# Patient Record
Sex: Female | Born: 1970 | Race: White | Hispanic: No | Marital: Married | Smoking: Former smoker
Health system: Southern US, Community
[De-identification: ages and names within clinical notes are randomized; demographics above are authoritative.]

## PROBLEM LIST (undated history)

## (undated) DIAGNOSIS — M1990 Unspecified osteoarthritis, unspecified site: Secondary | ICD-10-CM

## (undated) DIAGNOSIS — H409 Unspecified glaucoma: Secondary | ICD-10-CM

## (undated) DIAGNOSIS — D6851 Activated protein C resistance: Secondary | ICD-10-CM

## (undated) HISTORY — DX: Activated protein C resistance: D68.51

## (undated) HISTORY — PX: TUBAL LIGATION: SHX77

## (undated) HISTORY — PX: DILATION AND CURETTAGE OF UTERUS: SHX78

## (undated) HISTORY — DX: Unspecified glaucoma: H40.9

## (undated) HISTORY — DX: Unspecified osteoarthritis, unspecified site: M19.90

---

## 2005-11-25 ENCOUNTER — Ambulatory Visit (HOSPITAL_COMMUNITY): Admission: RE | Admit: 2005-11-25 | Discharge: 2005-11-25 | Payer: Self-pay | Admitting: Family Medicine

## 2007-02-04 ENCOUNTER — Ambulatory Visit (HOSPITAL_COMMUNITY): Admission: RE | Admit: 2007-02-04 | Discharge: 2007-02-04 | Payer: Self-pay | Admitting: Family Medicine

## 2007-02-09 ENCOUNTER — Ambulatory Visit (HOSPITAL_COMMUNITY): Admission: RE | Admit: 2007-02-09 | Discharge: 2007-02-09 | Payer: Self-pay | Admitting: Family Medicine

## 2010-05-27 ENCOUNTER — Encounter: Payer: Self-pay | Admitting: Physician Assistant

## 2010-05-27 ENCOUNTER — Ambulatory Visit (HOSPITAL_COMMUNITY): Admission: RE | Admit: 2010-05-27 | Discharge: 2010-05-27 | Payer: Self-pay | Admitting: Family Medicine

## 2010-05-27 ENCOUNTER — Ambulatory Visit: Payer: Self-pay | Admitting: Family Medicine

## 2010-05-27 DIAGNOSIS — M545 Low back pain, unspecified: Secondary | ICD-10-CM | POA: Insufficient documentation

## 2010-05-27 DIAGNOSIS — N644 Mastodynia: Secondary | ICD-10-CM

## 2010-09-07 ENCOUNTER — Encounter: Admission: RE | Admit: 2010-09-07 | Discharge: 2010-09-07 | Payer: Self-pay | Admitting: Obstetrics and Gynecology

## 2011-01-27 NOTE — Letter (Signed)
Summary: Laboratory/X-Ray Results  [facility]  [address]   [place], [place] [address]   Phone: [phone]  Fax: [phone]    Lab/X-Ray Results  May 27, 2010  MRN: [id number]  HAFSA LOHN [address] [place], [place]  [address]    The results of your recent lab/x-ray has been reviewed and were found:  Your Lumbar Spine xray is normal.  You may continue Ibuprofen since this is working well for your pain.  If you are interested in trying Physical Therapy please let me know and I can do a referral. This may help strengthen your back and reduce some of the pain that you are having.  If you have any questions, please contact our office.     Esperanza Sheets PA

## 2011-01-27 NOTE — Assessment & Plan Note (Signed)
Summary: new patient- room 1   Vital Signs:  Patient profile:   40 year old female Menstrual status:  regular LMP:     05/26/2010 Height:      62.75 inches Weight:      152.75 pounds BMI:     27.37 O2 Sat:      98 % on Room air Pulse rate:   86 / minute Resp:     16 per minute BP sitting:   106 / 60  (left arm)  Vitals Entered By: Adella Hare LPN (May 27, 2010 3:13 PM) CC: new patient Is Patient Diabetic? No Pain Assessment Patient in pain? no      LMP (date): 05/26/2010     Menstrual Status regular Enter LMP: 05/26/2010   CC:  new patient.  History of Present Illness: New pt here to establish care with new PCP. Pt states she was unable to get in to GYN for 2 weeks, so they recommended she see her PCP. She noticed last week a tender area in her Lt Breast with swelling. No redness & no nipple dischg.  Is better today.  She started her menses yest.  Drinks 4-5 Dt Pepsi's per day, plus 1 cup coffee per day.  This is no change from her usual x yrs. She reports a hx of fibrocystic breasts and has had 2 nl mamms and 1 nl ultrasound in the past.   Lumbar back pain x couple yrs.  No injury.  No radiation or parasthesias LE.  Pain increased with activities that cause her to be bent over.  She uses Motrin 800 mg as needed & works well.     Current Medications (verified): 1)  None  Allergies (verified): 1)  ! Phenergan  Past History:  Past medical, surgical, family and social histories (including risk factors) reviewed for relevance to current acute and chronic problems.  Past Medical History: Factor V Leiden  Past Surgical History: Tubal ligation (2003)  Family History: Reviewed history and no changes required. mother living- borderline emphasema father living- presumed healthy two brothers living- autism MGM - breast CA  Social History: Reviewed history and no changes required. employed part time at [employer] Married 6 years two children 16, 10 Current  Smoker half a pack a day Alcohol use-socially Drug use-no Smoking Status:  current Drug Use:  no  Review of Systems General:  Denies chills and fever. CV:  Denies chest pain or discomfort and palpitations. GI:  Denies change in bowel habits. GU:  No loss of bladder control. MS:  Complains of low back pain; denies joint pain and mid back pain.  Physical Exam  General:  Well-developed,well-nourished,in no acute distress; alert,appropriate and cooperative throughout examination Head:  Normocephalic and atraumatic without obvious abnormalities. No apparent alopecia or balding. Ears:  External ear exam shows no significant lesions or deformities.  Otoscopic examination reveals clear canals, tympanic membranes are intact bilaterally without bulging, retraction, inflammation or discharge. Hearing is grossly normal bilaterally. Nose:  External nasal examination shows no deformity or inflammation. Nasal mucosa are pink and moist without lesions or exudates. Mouth:  Oral mucosa and oropharynx without lesions or exudates.  Teeth in good repair. Neck:  No deformities, masses, or tenderness noted. Breasts:  Bilat breast are very fibrocystic.  All nodules are smooth and mobile.  No dominant mass.  No skin changes, erythema, etc. Lungs:  Normal respiratory effort, chest expands symmetrically. Lungs are clear to auscultation, no crackles or wheezes. Heart:  Normal rate and regular  rhythm. S1 and S2 normal without gallop, murmur, click, rub or other extra sounds. Msk:  LS spine pt reports TTP Lumbar L 2 & L 3 spinous processes.  Nontender Lumbar musculature, SI, or sciatic notches.normal ROM.  Neg SLR Extremities:  No clubbing, cyanosis, edema, or deformity noted with normal full range of motion of all joints.   Neurologic:  alert & oriented X3, sensation intact to light touch, gait normal, and DTRs symmetrical and normal.   Axillary Nodes:  No palpable lymphadenopathy   Impression &  Recommendations:  Problem # 1:  MASTALGIA (ICD-611.71) Assessment New Discussed with pt that this is probably due to her menstrual cycle.  And her discomfort is better today from starting her menses yesterday.  She has a GYN appt in 2 weeks.  I suggest that she have this re-evaluated in 1-2 weeks.  Since she already has a gyn appt makes sense to have it done then.   Also strongly encouraged pt to discontinue her caffeine.  Discussed the effects of caffeine on her breast tissue but also increased risk for osteoporosis as she gets older.  Problem # 2:  BACK PAIN, LUMBAR, CHRONIC (ICD-724.2) Assessment: Unchanged Continue Ibuprofen as needed. Orders: Miscellaneous Other Radiology (Misc Other Rad)  Patient Instructions: 1)  Please schedule a follow-up appointment as needed.  Keep your appt with GYN. 2)  I have ordered an xray of your Lumbar spine. 3)  continue Ibuprofen as needed at this time

## 2018-02-08 DIAGNOSIS — H40013 Open angle with borderline findings, low risk, bilateral: Secondary | ICD-10-CM | POA: Diagnosis not present

## 2018-04-27 DIAGNOSIS — R201 Hypoesthesia of skin: Secondary | ICD-10-CM | POA: Diagnosis not present

## 2018-12-30 ENCOUNTER — Other Ambulatory Visit: Payer: Self-pay

## 2018-12-30 ENCOUNTER — Emergency Department: Payer: BC Managed Care – PPO

## 2018-12-30 DIAGNOSIS — R609 Edema, unspecified: Secondary | ICD-10-CM | POA: Insufficient documentation

## 2018-12-30 DIAGNOSIS — Z5321 Procedure and treatment not carried out due to patient leaving prior to being seen by health care provider: Secondary | ICD-10-CM | POA: Diagnosis not present

## 2018-12-30 NOTE — ED Triage Notes (Signed)
Pt in with co DVT states has long hx of the same has a blood clotting disorder. Pt noticed today pain and swelling to left thigh without injury, concerned about clot.

## 2018-12-31 ENCOUNTER — Emergency Department
Admission: EM | Admit: 2018-12-31 | Discharge: 2018-12-31 | Discharge status: Against Medical Advice | Payer: BC Managed Care – PPO | Attending: Emergency Medicine | Admitting: Emergency Medicine

## 2019-01-02 ENCOUNTER — Telehealth: Payer: Self-pay | Admitting: Emergency Medicine

## 2019-01-02 NOTE — Telephone Encounter (Signed)
Called patient due to lwot to inquire about condition and follow up plans. No answer and mailbox is full. 

## 2019-11-08 ENCOUNTER — Other Ambulatory Visit: Payer: Self-pay

## 2019-11-08 ENCOUNTER — Ambulatory Visit
Admission: RE | Admit: 2019-11-08 | Discharge: 2019-11-08 | Disposition: A | Discharge status: Home / Self Care | Payer: BC Managed Care – PPO | Source: Ambulatory Visit | Attending: Family Medicine | Admitting: Family Medicine

## 2019-11-08 ENCOUNTER — Encounter: Payer: Self-pay | Admitting: Family Medicine

## 2019-11-08 ENCOUNTER — Ambulatory Visit (INDEPENDENT_AMBULATORY_CARE_PROVIDER_SITE_OTHER): Payer: BC Managed Care – PPO | Admitting: Family Medicine

## 2019-11-08 VITALS — BP 116/76 | HR 76 | Temp 98.1°F | Resp 14 | Ht 63.0 in | Wt 162.6 lb

## 2019-11-08 DIAGNOSIS — M25572 Pain in left ankle and joints of left foot: Secondary | ICD-10-CM

## 2019-11-08 DIAGNOSIS — Z8781 Personal history of (healed) traumatic fracture: Secondary | ICD-10-CM | POA: Diagnosis not present

## 2019-11-08 DIAGNOSIS — Z7689 Persons encountering health services in other specified circumstances: Secondary | ICD-10-CM

## 2019-11-08 DIAGNOSIS — H409 Unspecified glaucoma: Secondary | ICD-10-CM

## 2019-11-08 DIAGNOSIS — M19072 Primary osteoarthritis, left ankle and foot: Secondary | ICD-10-CM | POA: Diagnosis not present

## 2019-11-08 DIAGNOSIS — D6851 Activated protein C resistance: Secondary | ICD-10-CM

## 2019-11-08 DIAGNOSIS — I872 Venous insufficiency (chronic) (peripheral): Secondary | ICD-10-CM

## 2019-11-08 DIAGNOSIS — M25472 Effusion, left ankle: Secondary | ICD-10-CM | POA: Diagnosis not present

## 2019-11-08 DIAGNOSIS — M7989 Other specified soft tissue disorders: Secondary | ICD-10-CM | POA: Diagnosis not present

## 2019-11-08 DIAGNOSIS — G4700 Insomnia, unspecified: Secondary | ICD-10-CM

## 2019-11-08 DIAGNOSIS — G8929 Other chronic pain: Secondary | ICD-10-CM | POA: Insufficient documentation

## 2019-11-08 DIAGNOSIS — R600 Localized edema: Secondary | ICD-10-CM

## 2019-11-08 MED ORDER — TRAZODONE HCL 50 MG PO TABS: 25.0000 mg | ORAL_TABLET | Freq: Every day | ORAL | 1 refills | Status: DC

## 2019-11-08 NOTE — Patient Instructions (Signed)

## 2019-11-08 NOTE — Progress Notes (Signed)
Name: Angela Irwin   MRN: [id number]    DOB: 1971/04/23   Date:11/10/2019       Progress Note  Chief Complaint  Patient presents with   Establish Care   Ankle Pain    hx of break, to late to have surgery when it was healed, this was 27 years ago.  Having pain last week but better this week.  swelling   Insomnia    trouble going and staying asleep     Subjective:   Angela Irwin is a 48 y.o. female, presents to clinic  To establish care here She moved here last year from from [place], and 4 years ago from [place] where she is from.   She has hx of factor V leiden , blood to left leg 2x in the past only when pregnant Has been to Hem/Onc, dx with factor V and "slight factor VIII" -she has not needed any medications except for when pregnant  Left ankle distal tibia fx 27 years ago, was nearly a compound fx, she was pregnant and pregnancy was induced and her ankle surgery to repair fx was delayed and it healed in a deformed position with limited ROM, for decades it has been stiff and a little swollen with intermittent worsening but it has recently worsened. She has about once a week severe pain and swelling to left ankle and limited rom, some popping.   Last week the pain was all similar to her intermittent pain it just was much much more severe weith more swelling to the top and left side of foot and ankle.  She has been to a chiropractor but has not been to an orthopedist.  She does have much more swelling in the left leg from the ankle, last week was more swollen to the foot and sore, and her swelling does extend all the way up to her mid thigh on the left.  She states that she has never had a blood clot when she has been evaluated for the swelling in her leg.  She does have veins varicosities and reticular veins in her left foot and not any in her right, she has wearing compression stockings in the past but they made her leg hurt much more and they were not very helpful with the  swelling.  She also wants to discuss her insomnia: BH INSOMNIA ASSESSMENT:  Reported degree of insomnia:  Moderate Onset: years ago, teenager on. Progression: chronic. Sleep patterns during the week:    Routine before bed: watch TV     Bedroom environment: dark and quiet     Bedroom environment comment:  Besides TV   Typical bedtime during the week:  Midnight   Time to fall asleep:  2 hours   Number of nighttime awakenings:  Multiple   Unable to return to sleep after awakening: sometimes.     Activity when awakened: uses the bathroom     Typical morning wake-up time:  09:00   Premature morning awakening: No     Difficult to arouse in the morning: No     Working off-shift hours: No     Sleep aids used:  Non-prescription drugs (nyquil z, benadryl)   Frequency of use:  Four to six times per week (3-4 x a week) Different patterns on non-work/school days:    Different sleep patterns on non-work/school days: No   Napping:    Naps during the day: No   Evidence of other sleep disorders:  Obstructive sleep apnea: No     Restless leg syndrome: Yes (sometimes, mostly just pain from left ankle old injury)     Disturbing dreams: No     Night terrors: No   Substances that might interfere with sleep:    Caffeine: Yes (2 cups coffee, 2 diet pepsi's afternoon evening)     Nicotine: No     Alcohol: No     Other substances: No    She states that her whole life she has never been able to sleep, she is even tried Ambien before and it did nothing for her.  She is taking over-the-counter Tylenol PM or ibuprofen PM about 4 times a week just to get minimal amount of sleep.  Sometimes she is a little bit sleepy in the morning but if she has to get up and take care of her grandchildren she is able to get up and go.  She is very energetic in the room.  She has never discussed this before with any of her past PCPs.   Patient Active Problem List   Diagnosis Date Noted   Factor V Leiden (HCC)  11/10/2019   Glaucoma of both eyes 11/10/2019   Insomnia 11/10/2019   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 11/10/2019   History of fracture of left ankle 11/10/2019   MASTALGIA 05/27/2010   BACK PAIN, LUMBAR, CHRONIC 05/27/2010    Past Surgical History:  Procedure Laterality Date   DILATION AND CURETTAGE OF UTERUS      Family History  Problem Relation Age of Onset   Heart attack Mother 79   Hyperlipidemia Mother    Hypertension Mother    Kidney disease Mother    Breast cancer Maternal Grandmother    Lung cancer Maternal Grandfather    Autism Brother     Social History   Socioeconomic History   Marital status: Married    Spouse name: Feliz Beam   Number of children: 2   Years of education: 12   Highest education level: Some college, no degree  Occupational History   Occupation: homemaker  Ecologist strain: Not hard at all   Food insecurity    Worry: Never true    Inability: Never true   Transportation needs    Medical: No    Non-medical: No  Tobacco Use   Smoking status: Former Smoker   Smokeless tobacco: Never Used  Substance and Sexual Activity   Alcohol use: Never    Frequency: Never   Drug use: Not Currently   Sexual activity: Yes    Comment: tubal  Lifestyle   Physical activity    Days per week: 2 days    Minutes per session: 20 min   Stress: Not at all  Relationships   Social connections    Talks on phone: Never    Gets together: Never    Attends religious service: More than 4 times per year    Active member of club or organization: Yes    Attends meetings of clubs or organizations: Never    Relationship status: Married   Intimate partner violence    Fear of current or ex partner: No    Emotionally abused: No    Physically abused: No    Forced sexual activity: No  Other Topics Concern   Not on file  Social History Narrative   Not on file     Current  Outpatient Medications:    latanoprost (XALATAN) 0.005 %  ophthalmic solution, 1 drop at bedtime., Disp: , Rfl:    traZODone (DESYREL) 50 MG tablet, Take 0.5-2 tablets (25-100 mg total) by mouth at bedtime., Disp: 30 tablet, Rfl: 1  Allergies  Allergen Reactions   Promethazine Hcl     REACTION: gi upset    I personally reviewed active problem list, medication list, allergies, family history, social history, health maintenance, notes from last encounter, lab results, imaging with the patient/caregiver today.  Review of Systems  Constitutional: Negative.  Negative for activity change, appetite change, fatigue and unexpected weight change.  HENT: Negative.   Eyes: Negative.   Respiratory: Negative.  Negative for shortness of breath.   Cardiovascular: Negative.  Negative for chest pain, palpitations and leg swelling.  Gastrointestinal: Negative.  Negative for abdominal pain and blood in stool.  Endocrine: Negative.   Genitourinary: Negative.   Musculoskeletal: Negative.  Negative for arthralgias, gait problem, joint swelling and myalgias.  Skin: Negative.  Negative for color change, pallor and rash.  Allergic/Immunologic: Negative.   Neurological: Negative.  Negative for syncope and weakness.  Hematological: Negative.   Psychiatric/Behavioral: Negative.  Negative for confusion, dysphoric mood, self-injury and suicidal ideas. The patient is not nervous/anxious.      Objective:    Vitals:   11/08/19 1410  BP: 116/76  Pulse: 76  Resp: 14  Temp: 98.1 F (36.7 C)  SpO2: 97%  Weight: 162 lb 9.6 oz (73.8 kg)  Height: 5\' 3"  (1.6 m)    Body mass index is 28.8 kg/m.  Physical Exam Vitals signs and nursing note reviewed.  Constitutional:      General: She is not in acute distress.    Appearance: Normal appearance. She is well-developed. She is not ill-appearing, toxic-appearing or diaphoretic.     Interventions: Face mask in place.  HENT:     Head: Normocephalic and  atraumatic.     Right Ear: External ear normal.     Left Ear: External ear normal.  Eyes:     General: Lids are normal. No scleral icterus.       Right eye: No discharge.        Left eye: No discharge.     Conjunctiva/sclera: Conjunctivae normal.  Neck:     Musculoskeletal: Normal range of motion and neck supple.     Trachea: Phonation normal. No tracheal deviation.  Cardiovascular:     Rate and Rhythm: Normal rate and regular rhythm.     Pulses: Normal pulses.          Radial pulses are 2+ on the right side and 2+ on the left side.       Posterior tibial pulses are 2+ on the right side and 2+ on the left side.     Heart sounds: Normal heart sounds. No murmur. No friction rub. No gallop.   Pulmonary:     Effort: Pulmonary effort is normal. No respiratory distress.     Breath sounds: Normal breath sounds. No stridor. No wheezing, rhonchi or rales.  Chest:     Chest wall: No tenderness.  Abdominal:     General: Bowel sounds are normal. There is no distension.     Palpations: Abdomen is soft.     Tenderness: There is no abdominal tenderness. There is no guarding or rebound.  Musculoskeletal:        General: No deformity.     Right ankle: Normal.     Left ankle: She exhibits decreased range of motion and swelling. She exhibits no ecchymosis  and no deformity. No tenderness.     Right lower leg: No edema.     Left lower leg: No edema.     Comments: Left medial ankle slight abnormality at the medial malleolus but no current deformity or tenderness, large area of edematous tissue posterior and inferior to medial malleolus and numerous purple reticular veins to medial foot and ankle with nonpitting edema to the left ankle, foot and lower extremity, greater than the right.  Right medial foot very minimal if any reticular veins  Lymphadenopathy:     Cervical: No cervical adenopathy.  Skin:    General: Skin is warm and dry.     Capillary Refill: Capillary refill takes less than 2 seconds.      Coloration: Skin is not jaundiced or pale.     Findings: No rash.  Neurological:     Mental Status: She is alert and oriented to person, place, and time.     Motor: No abnormal muscle tone.     Gait: Gait normal.  Psychiatric:        Attention and Perception: Attention normal.        Mood and Affect: Mood and affect normal.        Speech: Speech normal.        Behavior: Behavior is hyperactive. Behavior is cooperative.        Thought Content: Thought content normal.       PHQ2/9: Depression screen PHQ 2/9 11/08/2019  Decreased Interest 0  Down, Depressed, Hopeless 0  PHQ - 2 Score 0  Altered sleeping 3  Tired, decreased energy 1  Change in appetite 0  Feeling bad or failure about yourself  0  Trouble concentrating 0  Moving slowly or fidgety/restless 0  Suicidal thoughts 0  PHQ-9 Score 4  Difficult doing work/chores Somewhat difficult    phq 9 is positive But pt has insomnia related sx, no down moods or depression anxiety - addressing insomnia  Fall Risk: Fall Risk  11/08/2019  Falls in the past year? 1  Number falls in past yr: 1  Injury with Fall? 0    Functional Status Survey: Is the patient deaf or have difficulty hearing?: No Does the patient have difficulty seeing, even when wearing glasses/contacts?: Yes Does the patient have difficulty concentrating, remembering, or making decisions?: No Does the patient have difficulty walking or climbing stairs?: No Does the patient have difficulty dressing or bathing?: No Does the patient have difficulty doing errands alone such as visiting a doctor's office or shopping?: No    Assessment & Plan:     ICD-10-CM   1. Chronic pain of left ankle  M25.572 DG Ankle Complete Left   G89.29 Ambulatory referral to Orthopedic Surgery   Acute on chronic with decreased range of motion and stiffness worse the past couple months  2. History of fracture of left ankle  Z87.81 DG Ankle Complete Left    Ambulatory referral to  Orthopedic Surgery  3. Left ankle swelling  M25.472 DG Ankle Complete Left    Ambulatory referral to Orthopedic Surgery   Likely secondary to arthritis but also some vascular insufficiency given the appearance of her left lower leg and foot  4. 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  I87.2    R60.0    suspect venous stasis secondary to old trauma, varicosities and edema up to left thigh  5. Insomnia, unspecified type  G47.00 traZODone (DESYREL) 50 MG tablet   Already taking p.m.  supplements, failed Ambien when she borrowed from a friend, trial of trazodone?  Sleep hygiene  6. Glaucoma of both eyes, unspecified glaucoma type  H40.9    Patient reports history of glaucoma does see ophthalmology  7. Factor V Leiden [facility])  [phone]    Patient consulted with heme-onc a long time ago no blood clots since pregnancies 26 and 19 years ago  8. Encounter to establish care with new doctor  [phone]    Requesting records will need to review and update   Did discuss with her ankle that there is likely some venous stasis and vascular insufficiency and that a vascular referral would be helpful to work-up her whole left leg swelling.  Also discussed a referral to a foot and ankle orthopedic surgeon specialist which may be able to evaluate her old injury or possibly offer injections or other options that would help her with her pain and limited range of motion.  Patient agreed to see the orthopedic specialist but did not want to see vascular specialist just yet.    From insomnia we will do a trial of trazodone 25 to 100 mg about an hour to 2 before bedtime, discussed at length sleep hygiene, decreasing caffeine, exercising etc.  Return for CPE.   Danelle Berry, PA-C 11/10/19 6:48 PM

## 2019-11-10 ENCOUNTER — Telehealth: Payer: Self-pay

## 2019-11-10 DIAGNOSIS — I872 Venous insufficiency (chronic) (peripheral): Secondary | ICD-10-CM | POA: Insufficient documentation

## 2019-11-10 DIAGNOSIS — H409 Unspecified glaucoma: Secondary | ICD-10-CM | POA: Insufficient documentation

## 2019-11-10 DIAGNOSIS — M25572 Pain in left ankle and joints of left foot: Secondary | ICD-10-CM

## 2019-11-10 DIAGNOSIS — Z8781 Personal history of (healed) traumatic fracture: Secondary | ICD-10-CM | POA: Insufficient documentation

## 2019-11-10 DIAGNOSIS — G8929 Other chronic pain: Secondary | ICD-10-CM

## 2019-11-10 DIAGNOSIS — G4700 Insomnia, unspecified: Secondary | ICD-10-CM | POA: Insufficient documentation

## 2019-11-10 DIAGNOSIS — D6851 Activated protein C resistance: Secondary | ICD-10-CM | POA: Insufficient documentation

## 2019-11-10 NOTE — Telephone Encounter (Signed)
-----   Message from Delsa Grana, [place] sent at 11/09/2019  6:28 PM EST ----- Please call patient with her x-ray results  It does show the signs of previous fracture involving the distal fibula and it shows some degenerative/arthritic changes in the midfoot.  It also shows enthesopathy - enthesopathy refers to a disorder involving the attachment of a tendon or ligament to a bone on the bottom of the foot - plantar. Interestingly -they see something in the subcutaneous fat at the posterior aspect of the left lower extremity that they call - serpiginous - which may suggest dilated venous structures -what they see in the x-ray imaging does correlate with what swelling and skin changes she has with dark veins in the area.  Is still very be very helpful to see both a vascular specialist and orthopedist to help with her swelling and pain in her foot

## 2019-11-14 ENCOUNTER — Telehealth: Payer: Self-pay

## 2019-11-14 NOTE — Telephone Encounter (Signed)
Copied from [place] [id number]. Topic: General - Other >> Nov 14, 2019  2:11 PM Wynetta Emery, [place] C wrote: Reason for CRM: Markham Jordan with [facility] is calling in to discuss referral that was sent to them for pt.   Please Advise : [phone]   After consulting with Kristeen Miss, clarity was given to [facility] on the reason for the referral and she is now able to proceed with this referral.

## 2019-12-04 ENCOUNTER — Other Ambulatory Visit: Payer: Self-pay | Admitting: Family Medicine

## 2019-12-04 ENCOUNTER — Ambulatory Visit (INDEPENDENT_AMBULATORY_CARE_PROVIDER_SITE_OTHER): Payer: BC Managed Care – PPO | Admitting: Family Medicine

## 2019-12-04 ENCOUNTER — Other Ambulatory Visit (HOSPITAL_COMMUNITY)
Admission: RE | Admit: 2019-12-04 | Discharge: 2019-12-04 | Disposition: A | Discharge status: Home / Self Care | Payer: BC Managed Care – PPO | Source: Ambulatory Visit | Attending: Family Medicine | Admitting: Family Medicine

## 2019-12-04 ENCOUNTER — Encounter: Payer: Self-pay | Admitting: Family Medicine

## 2019-12-04 ENCOUNTER — Other Ambulatory Visit: Payer: Self-pay

## 2019-12-04 VITALS — BP 112/70 | HR 83 | Temp 98.5°F | Resp 14 | Ht 63.0 in | Wt 167.3 lb

## 2019-12-04 DIAGNOSIS — Z0000 Encounter for general adult medical examination without abnormal findings: Secondary | ICD-10-CM | POA: Insufficient documentation

## 2019-12-04 DIAGNOSIS — R635 Abnormal weight gain: Secondary | ICD-10-CM

## 2019-12-04 DIAGNOSIS — Z124 Encounter for screening for malignant neoplasm of cervix: Secondary | ICD-10-CM | POA: Diagnosis not present

## 2019-12-04 DIAGNOSIS — Z130 Encounter for screening for diseases of the blood and blood-forming organs and certain disorders involving the immune mechanism: Secondary | ICD-10-CM

## 2019-12-04 DIAGNOSIS — B3731 Acute candidiasis of vulva and vagina: Secondary | ICD-10-CM

## 2019-12-04 DIAGNOSIS — B373 Candidiasis of vulva and vagina: Secondary | ICD-10-CM

## 2019-12-04 DIAGNOSIS — Z1329 Encounter for screening for other suspected endocrine disorder: Secondary | ICD-10-CM

## 2019-12-04 DIAGNOSIS — Z13220 Encounter for screening for lipoid disorders: Secondary | ICD-10-CM

## 2019-12-04 DIAGNOSIS — Z13228 Encounter for screening for other metabolic disorders: Secondary | ICD-10-CM | POA: Diagnosis not present

## 2019-12-04 DIAGNOSIS — Z1231 Encounter for screening mammogram for malignant neoplasm of breast: Secondary | ICD-10-CM

## 2019-12-04 NOTE — Patient Instructions (Addendum)
Preventive Care 81-47 Years Old, Female Preventive care refers to visits with your health care provider and lifestyle choices that can promote health and wellness. This includes:  A yearly physical exam. This may also be called an annual well check.  Regular dental visits and eye exams.  Immunizations.  Screening for certain conditions.  Healthy lifestyle choices, such as eating a healthy diet, getting regular exercise, not using drugs or products that contain nicotine and tobacco, and limiting alcohol use. What can I expect for my preventive care visit? Physical exam Your health care provider will check your:  Height and weight. This may be used to calculate body mass index (BMI), which tells if you are at a healthy weight.  Heart rate and blood pressure.  Skin for abnormal spots. Counseling Your health care provider may ask you questions about your:  Alcohol, tobacco, and drug use.  Emotional well-being.  Home and relationship well-being.  Sexual activity.  Eating habits.  Work and work Statistician.  Method of birth control.  Menstrual cycle.  Pregnancy history. What immunizations do I need?  Influenza (flu) vaccine  This is recommended every year. Tetanus, diphtheria, and pertussis (Tdap) vaccine  You may need a Td booster every 10 years. Varicella (chickenpox) vaccine  You may need this if you have not been vaccinated. Zoster (shingles) vaccine  You may need this after age 2. Measles, mumps, and rubella (MMR) vaccine  You may need at least one dose of MMR if you were born in 1957 or later. You may also need a second dose. Pneumococcal conjugate (PCV13) vaccine  You may need this if you have certain conditions and were not previously vaccinated. Pneumococcal polysaccharide (PPSV23) vaccine  You may need one or two doses if you smoke cigarettes or if you have certain conditions. Meningococcal conjugate (MenACWY) vaccine  You may need this if you  have certain conditions. Hepatitis A vaccine  You may need this if you have certain conditions or if you travel or work in places where you may be exposed to hepatitis A. Hepatitis B vaccine  You may need this if you have certain conditions or if you travel or work in places where you may be exposed to hepatitis B. Haemophilus influenzae type b (Hib) vaccine  You may need this if you have certain conditions. Human papillomavirus (HPV) vaccine  If recommended by your health care provider, you may need three doses over 6 months. You may receive vaccines as individual doses or as more than one vaccine together in one shot (combination vaccines). Talk with your health care provider about the risks and benefits of combination vaccines. What tests do I need? Blood tests  Lipid and cholesterol levels. These may be checked every 5 years, or more frequently if you are over 47 years old.  Hepatitis C test.  Hepatitis B test. Screening  Lung cancer screening. You may have this screening every year starting at age 47 if you have a 30-pack-year history of smoking and currently smoke or have quit within the past 15 years.  Colorectal cancer screening. All adults should have this screening starting at age 25 and continuing until age 47. Your health care provider may recommend screening at age 47 if you are at increased risk. You will have tests every 1-10 years, depending on your results and the type of screening test.  Diabetes screening. This is done by checking your blood sugar (glucose) after you have not eaten for a while (fasting). You may have this  done every 1-3 years.  Mammogram. This may be done every 1-2 years. Talk with your health care provider about when you should start having regular mammograms. This may depend on whether you have a family history of breast cancer.  BRCA-related cancer screening. This may be done if you have a family history of breast, ovarian, tubal, or peritoneal  cancers.  Pelvic exam and Pap test. This may be done every 3 years starting at age 47. Starting at age 35, this may be done every 5 years if you have a Pap test in combination with an HPV test. Other tests  Sexually transmitted disease (STD) testing.  Bone density scan. This is done to screen for osteoporosis. You may have this scan if you are at high risk for osteoporosis. Follow these instructions at home: Eating and drinking  Eat a diet that includes fresh fruits and vegetables, whole grains, lean protein, and low-fat dairy.  Take vitamin and mineral supplements as recommended by your health care provider.  Do not drink alcohol if: ? Your health care provider tells you not to drink. ? You are pregnant, may be pregnant, or are planning to become pregnant.  If you drink alcohol: ? Limit how much you have to 0-1 drink a day. ? Be aware of how much alcohol is in your drink. In the [place] one drink equals one 12 oz bottle of beer (355 mL), one 5 oz glass of wine (148 mL), or one 1 oz glass of hard liquor (44 mL). Lifestyle  Take daily care of your teeth and gums.  Stay active. Exercise for at least 30 minutes on 5 or more days each week.  Do not use any products that contain nicotine or tobacco, such as cigarettes, e-cigarettes, and chewing tobacco. If you need help quitting, ask your health care provider.  If you are sexually active, practice safe sex. Use a condom or other form of birth control (contraception) in order to prevent pregnancy and STIs (sexually transmitted infections).  If told by your health care provider, take low-dose aspirin daily starting at age 9. What's next?  Visit your health care provider once a year for a well check visit.  Ask your health care provider how often you should have your eyes and teeth checked.  Stay up to date on all vaccines. This information is not intended to replace advice given to you by your health care provider. Make sure you  discuss any questions you have with your health care provider. Document Released: 01/10/2016 Document Revised: 08/25/2018 Document Reviewed: 08/25/2018 Elsevier Patient Education  [facility]) Exercise Recommendation  Being physically active is important to prevent heart disease and stroke, the nation's No. 1and No. 5killers. To improve overall cardiovascular health, we suggest at least 150 minutes per week of moderate exercise or 75 minutes per week of vigorous exercise (or a combination of moderate and vigorous activity). Thirty minutes a day, five times a week is an easy goal to remember. You will also experience benefits even if you divide your time into two or three segments of 10 to 15 minutes per day.  For people who would benefit from lowering their blood pressure or cholesterol, we recommend 40 minutes of aerobic exercise of moderate to vigorous intensity three to four times a week to lower the risk for heart attack and stroke.  Physical activity is anything that makes you move your body and burn calories.  This includes things like climbing stairs  or playing sports. Aerobic exercises benefit your heart, and include walking, jogging, swimming or biking. Strength and stretching exercises are best for overall stamina and flexibility.  The simplest, positive change you can make to effectively improve your heart health is to start walking. It's enjoyable, free, easy, social and great exercise. A walking program is flexible and boasts high success rates because people can stick with it. It's easy for walking to become a regular and satisfying part of life.   For Overall Cardiovascular Health:  At least 30 minutes of moderate-intensity aerobic activity at least 5 days per week for a total of 150  OR   At least 25 minutes of vigorous aerobic activity at least 3 days per week for a total of 75 minutes; or a combination of moderate- and  vigorous-intensity aerobic activity  AND   Moderate- to high-intensity muscle-strengthening activity at least 2 days per week for additional health benefits.  For Lowering Blood Pressure and Cholesterol  An average 40 minutes of moderate- to vigorous-intensity aerobic activity 3 or 4 times per week  What if I can't make it to the time goal? Something is always better than nothing! And everyone has to start somewhere. Even if you've been sedentary for years, today is the day you can begin to make healthy changes in your life. If you don't think you'll make it for 30 or 40 minutes, set a reachable goal for today. You can work up toward your overall goal by increasing your time as you get stronger. Don't let all-or-nothing thinking rob you of doing what you can every day.  Source:http://www.heart.org

## 2019-12-04 NOTE — Progress Notes (Signed)
Patient: Angela Irwin, Female    DOB: 1971-04-23, 48 y.o.   MRN: [id number] Delsa Grana, PA-C Visit Date: 12/08/2019  Today's Provider: Delsa Grana, PA-C   Chief Complaint  Patient presents with   Annual Exam    with pap   Subjective:   Annual physical exam:  Angela Irwin is a 48 y.o. female who presents today for complete physical exam:  Exercise/Activity:  Trying some home exercises  Diet/nutrition:  Tried keto  Sleep: poorly.  USPSTF grade A and B recommendations - reviewed and addressed today  Depression:  Phq 9 completed today by patient, was reviewed by me with patient in the room, score is  negative, pt feels good PHQ 2/9 Scores 12/04/2019 11/08/2019  PHQ - 2 Score 0 0  PHQ- 9 Score 2 4   Depression screen [facility] 2/9 12/04/2019 11/08/2019  Decreased Interest 0 0  Down, Depressed, Hopeless 0 0  PHQ - 2 Score 0 0  Altered sleeping 2 3  Tired, decreased energy 0 1  Change in appetite 0 0  Feeling bad or failure about yourself  0 0  Trouble concentrating 0 0  Moving slowly or fidgety/restless 0 0  Suicidal thoughts 0 0  PHQ-9 Score 2 4  Difficult doing work/chores Not difficult at all Somewhat difficult    Alcohol screening:   [facility] Visit from 12/04/2019 in [facility]  AUDIT-C Score  0      Immunizations and Health Maintenance: Health Maintenance  Topic Date Due   HIV Screening  08/15/1987   INFLUENZA VACCINE  03/27/2020 (Originally 07/29/2019)   TETANUS/TDAP  12/03/2020 (Originally 08/15/1991)   PAP SMEAR-Modifier  12/03/2022     Hep C Screening: not indicated  STD testing and prevention (HIV/chl/gon/syphilis): HIV deferred   Intimate partner violence:  None, feels safe  Sexual History/Pain during Intercourse:  No pain  Married  Menstrual History/LMP/Abnormal Bleeding:    Patient's last menstrual period was 11/28/2019. Mostly monthly, lasts 3-4 days  Incontinence Symptoms:  none  Breast cancer:  Last  Mammogram: 2018, over due, as done at [facility] BRCA gene screening:   Cervical cancer screening: due for pap today  Family hx of cancers - breast, ovarian, uterine, colon:  Grandmother maternal. 40's  Osteoporosis:   Discussed high calcium and vitamin D supplementation, weight bearing exercises   Skin cancer:  Hx of skin CA -  NO Discussed atypical lesions   Colorectal cancer:   Colonoscopy - not done   Lung cancer:   Low Dose CT Chest recommended if Age 35-80 years, 30 pack-year currently smoking OR have quit w/in 15years. Patient does not qualify.   Social History   Tobacco Use   Smoking status: Former Smoker   Smokeless tobacco: Never Used  Substance Use Topics   Alcohol use: Never     ECG: none indicated   Blood pressure/Hypertension: BP Readings from Last 3 Encounters:  12/04/19 112/70  11/08/19 116/76  12/30/18 136/63    Weight/Obesity: Wt Readings from Last 3 Encounters:  12/04/19 167 lb 4.8 oz (75.9 kg)  11/08/19 162 lb 9.6 oz (73.8 kg)  12/30/18 155 lb (70.3 kg)   BMI Readings from Last 3 Encounters:  12/04/19 29.64 kg/m  11/08/19 28.80 kg/m  12/30/18 27.46 kg/m     Lipids:  Lab Results  Component Value Date   CHOL 269 (H) 12/04/2019   Lab Results  Component Value Date   HDL 52 12/04/2019   Lab Results  Component Value  Date   LDLCALC 192 (H) 12/04/2019   Lab Results  Component Value Date   TRIG 122 12/04/2019   Lab Results  Component Value Date   CHOLHDL 5.2 (H) 12/04/2019   No results found for: LDLDIRECT Based on the results of lipid panel his/her cardiovascular risk factor ( using [facility] )  in the next 10 years is: The 10-year ASCVD risk score Mikey Bussing DC Brooke Bonito., et al., 2013) is: 1.3%   Values used to calculate the score:     Age: 71 years     Sex: Female     Is Non-Hispanic African American: No     Diabetic: No     Tobacco smoker: No     Systolic Blood Pressure: 657 mmHg     Is BP treated: No     HDL Cholesterol: 52  mg/dL     Total Cholesterol: 269 mg/dL  Glucose:  Glucose, Bld  Date Value Ref Range Status  12/04/2019 94 65 - 99 mg/dL Final    Comment:    .            Fasting reference interval .       Office Visit from 12/04/2019 in [facility]  AUDIT-C Score  0     Depression: Phq 9 is  negative Depression screen [facility] 2/9 12/04/2019 11/08/2019  Decreased Interest 0 0  Down, Depressed, Hopeless 0 0  PHQ - 2 Score 0 0  Altered sleeping 2 3  Tired, decreased energy 0 1  Change in appetite 0 0  Feeling bad or failure about yourself  0 0  Trouble concentrating 0 0  Moving slowly or fidgety/restless 0 0  Suicidal thoughts 0 0  PHQ-9 Score 2 4  Difficult doing work/chores Not difficult at all Somewhat difficult   Hypertension: BP Readings from Last 3 Encounters:  12/04/19 112/70  11/08/19 116/76  12/30/18 136/63   Obesity: Wt Readings from Last 3 Encounters:  12/04/19 167 lb 4.8 oz (75.9 kg)  11/08/19 162 lb 9.6 oz (73.8 kg)  12/30/18 155 lb (70.3 kg)   BMI Readings from Last 3 Encounters:  12/04/19 29.64 kg/m  11/08/19 28.80 kg/m  12/30/18 27.46 kg/m      Advanced Care Planning:  A voluntary discussion about advance care planning including the explanation and discussion of advance directives.   Discussed health care proxy and Living will, and the patient was able to identify a health care proxy as Ceasar Lund.   Patient does not have a living will at present time.   Social History      She  reports that she has quit smoking. She has never used smokeless tobacco. She reports previous drug use. She reports that she does not drink alcohol.       Social History   Socioeconomic History   Marital status: Married    Spouse name: Darnelle Maffucci   Number of children: 2   Years of education: 12   Highest education level: Some college, no degree  Occupational History   Occupation: homemaker  Tobacco Use   Smoking status: Former Smoker   Smokeless  tobacco: Never Used  Substance and Sexual Activity   Alcohol use: Never   Drug use: Not Currently   Sexual activity: Yes    Comment: tubal  Other Topics Concern   Not on file  Social History Narrative   Not on file   Social Determinants of Health   Financial Resource Strain: Low Risk  Difficulty of Paying Living Expenses: Not hard at all  Food Insecurity: No Food Insecurity   Worried About [place] in the Last Year: Never true   Ran Out of Food in the Last Year: Never true  Transportation Needs: No Transportation Needs   Lack of Transportation (Medical): No   Lack of Transportation (Non-Medical): No  Physical Activity: Insufficiently Active   Days of Exercise per Week: 2 days   Minutes of Exercise per Session: 20 min  Stress: No Stress Concern Present   Feeling of Stress : Not at all  Social Connections: Slightly Isolated   Frequency of Communication with Friends and Family: Never   Frequency of Social Gatherings with Friends and Family: Never   Attends Religious Services: More than 4 times per year   Active Member of [employer] or Organizations: Yes   Attends Archivist Meetings: Never   Marital Status: Married    Family History        Family Status  Relation Name Status   Mother  Alive   Father  Alive       didn't really know him, she will get more med hx   Brother  Alive   Daughter  Alive   MGM  Deceased       cancer   MGF  Deceased       lung cancer   PGM  Deceased   PGF  Deceased   Brother youngest 42   Daughter  Alive        Her family history includes Autism in her brother; Breast cancer in her maternal grandmother; Heart attack (age of onset: 84) in her mother; Hyperlipidemia in her mother; Hypertension in her mother; Kidney disease in her mother; Lung cancer in her maternal grandfather.       Family History  Problem Relation Age of Onset   Heart attack Mother 43   Hyperlipidemia Mother     Hypertension Mother    Kidney disease Mother    Breast cancer Maternal Grandmother    Lung cancer Maternal Grandfather    Autism Brother     Patient Active Problem List   Diagnosis Date Noted   Factor V Leiden ([place]) 11/10/2019   Glaucoma of both eyes 11/10/2019   Insomnia 11/10/2019   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 11/10/2019   History of fracture of left ankle 11/10/2019   MASTALGIA 05/27/2010   BACK PAIN, LUMBAR, CHRONIC 05/27/2010    Past Surgical History:  Procedure Laterality Date   DILATION AND CURETTAGE OF UTERUS      No current outpatient medications on file.  Allergies  Allergen Reactions   Promethazine Hcl     REACTION: gi upset    Patient Care Team: Delsa Grana, PA-C as PCP - General (Family Medicine)  Review of Systems  Constitutional: Negative.  Negative for activity change, appetite change, fatigue and unexpected weight change.  HENT: Negative.   Eyes: Negative.   Respiratory: Negative.  Negative for shortness of breath.   Cardiovascular: Negative.  Negative for chest pain, palpitations and leg swelling.  Gastrointestinal: Negative.  Negative for abdominal pain and blood in stool.  Endocrine: Negative.   Genitourinary: Negative.   Musculoskeletal: Negative.  Negative for arthralgias, gait problem, joint swelling and myalgias.  Skin: Negative.  Negative for color change, pallor and rash.  Allergic/Immunologic: Negative.   Neurological: Negative.  Negative for syncope and weakness.  Hematological: Negative.   Psychiatric/Behavioral: Positive for sleep disturbance.  Negative for confusion, dysphoric mood, self-injury and suicidal ideas. The patient is not nervous/anxious.      I personally reviewed active problem list, medication list, allergies, family history, social history, health maintenance, notes from last encounter, lab results, imaging with the patient/caregiver today.       Objective:   Vitals:   Vitals:   12/04/19 1527  BP: 112/70  Pulse: 83  Resp: 14  Temp: 98.5 F (36.9 C)  SpO2: 97%  Weight: 167 lb 4.8 oz (75.9 kg)  Height: _0  (1.6 m)    Body mass index is 29.64 kg/m.  Physical Exam Vitals and nursing note reviewed. Exam conducted with a chaperone present.  Constitutional:      General: She is not in acute distress.    Appearance: Normal appearance. She is well-developed. She is obese. She is not ill-appearing, toxic-appearing or diaphoretic.  HENT:     Head: Normocephalic and atraumatic.     Right Ear: External ear normal.     Left Ear: External ear normal.     Nose: Nose normal.     Mouth/Throat:     Pharynx: Uvula midline.  Eyes:     General: Lids are normal.     Conjunctiva/sclera: Conjunctivae normal.     Pupils: Pupils are equal, round, and reactive to light.  Neck:     Trachea: Phonation normal. No tracheal deviation.  Cardiovascular:     Rate and Rhythm: Normal rate and regular rhythm.     Pulses: Normal pulses.          Radial pulses are 2+ on the right side and 2+ on the left side.       Posterior tibial pulses are 2+ on the right side and 2+ on the left side.     Heart sounds: Normal heart sounds. No murmur. No friction rub. No gallop.   Pulmonary:     Effort: Pulmonary effort is normal. No respiratory distress.     Breath sounds: Normal breath sounds. No stridor. No wheezing, rhonchi or rales.  Chest:     Chest wall: No tenderness.     Breasts:        Right: Normal.        Left: Normal.  Abdominal:     General: Bowel sounds are normal. There is no distension.     Palpations: Abdomen is soft.     Tenderness: There is no abdominal tenderness. There is no guarding or rebound.  Genitourinary:    Vagina: Foreign body present. No signs of injury. Vaginal discharge present. No erythema, tenderness, bleeding, lesions or prolapsed vaginal walls.     Cervix: Normal and dilated.     Uterus: Normal.      Adnexa: Right adnexa normal and left  adnexa normal.     Comments: PAP done Musculoskeletal:        General: No deformity. Normal range of motion.     Cervical back: Normal range of motion and neck supple.  Lymphadenopathy:     Cervical: No cervical adenopathy.     Upper Body:     Right upper body: No supraclavicular, axillary or pectoral adenopathy.     Left upper body: No supraclavicular, axillary or pectoral adenopathy.  Skin:    General: Skin is warm and dry.     Capillary Refill: Capillary refill takes less than 2 seconds.     Coloration: Skin is not pale.     Findings: No rash.  Neurological:     Mental  Status: She is alert and oriented to person, place, and time.     Motor: No abnormal muscle tone.     Gait: Gait normal.  Psychiatric:        Speech: Speech normal.        Behavior: Behavior normal.       Fall Risk: Fall Risk  12/04/2019 11/08/2019  Falls in the past year? 0 1  Number falls in past yr: 0 1  Injury with Fall? 0 0    Functional Status Survey: Is the patient deaf or have difficulty hearing?: No Does the patient have difficulty seeing, even when wearing glasses/contacts?: No Does the patient have difficulty concentrating, remembering, or making decisions?: No Does the patient have difficulty walking or climbing stairs?: No Does the patient have difficulty dressing or bathing?: No Does the patient have difficulty doing errands alone such as visiting a doctor's office or shopping?: No   Assessment & Plan:    CPE completed today   USPSTF grade A and B recommendations reviewed with patient; age-appropriate recommendations, preventive care, screening tests, etc discussed and encouraged; healthy living encouraged; see AVS for patient education given to patient   Discussed importance of 150 minutes of physical activity weekly, AHA exercise recommendations given to pt in AVS/handout   Discussed importance of healthy diet:  eating lean meats and proteins, avoiding trans fats and saturated  fats, avoid simple sugars and excessive carbs in diet, eat 6 servings of fruit/vegetables daily and drink plenty of water and avoid sweet beverages.     Recommended pt to do annual eye exam and routine dental exams/cleanings   Depression, alcohol, fall screening completed as documented above and per flowsheets   Reviewed Health Maintenance: Health Maintenance  Topic Date Due   HIV Screening  08/15/1987   INFLUENZA VACCINE  03/27/2020 (Originally 07/29/2019)   TETANUS/TDAP  12/03/2020 (Originally 08/15/1991)   PAP SMEAR-Modifier  12/03/2022     Immunizations:  There is no immunization history on file for this patient.   Orders Placed This Encounter  Procedures   CBC w/ Diff   CMP w GFR   Lipid Panel    Order Specific Question:   Has the patient fasted?    Answer:   Yes   TSH      ICD-10-CM   1. Adult general medical exam  Z00.00 CBC w/ Diff    CMP w GFR    Lipid Panel    TSH    PAP with HPV  2. Screening for endocrine, metabolic and immunity disorder  Z13.29 CBC w/ Diff   Z13.228 CMP w GFR   Z13.0 Lipid Panel    TSH  3. Screening for lipoid disorders  Z13.220 Lipid Panel  4. Screening for malignant neoplasm of cervix  Z12.4 PAP with HPV  5. Encounter for screening mammogram for malignant neoplasm of breast  Z12.31    breast exam done, nothing suspicious, order filled out and faxed to [facility]  6. Weight gain  R63.5 TSH   check TSH  7. Yeast vaginitis  B37.3    incidental on exam while doing PAP, pt asx, did not want any tx        Delsa Grana, PA-C 12/04/2019  [facility]

## 2019-12-05 LAB — COMPLETE METABOLIC PANEL WITH GFR
AG Ratio: 1.6 (calc) (ref 1.0–2.5)
ALT: 17 U/L (ref 6–29)
AST: 16 U/L (ref 10–35)
Albumin: 4.2 g/dL (ref 3.6–5.1)
Alkaline phosphatase (APISO): 37 U/L (ref 31–125)
BUN: 21 mg/dL (ref 7–25)
CO2: 26 mmol/L (ref 20–32)
Calcium: 9.4 mg/dL (ref 8.6–10.2)
Chloride: 104 mmol/L (ref 98–110)
Creat: 0.95 mg/dL (ref 0.50–1.10)
GFR, Est African American: 83 mL/min/{1.73_m2} (ref >=60)
GFR, Est Non African American: 71 mL/min/{1.73_m2} (ref >=60)
Globulin: 2.7 g/dL (calc) (ref 1.9–3.7)
Glucose, Bld: 94 mg/dL (ref 65–99)
Potassium: 4.3 mmol/L (ref 3.5–5.3)
Sodium: 138 mmol/L (ref 135–146)
Total Bilirubin: 0.4 mg/dL (ref 0.2–1.2)
Total Protein: 6.9 g/dL (ref 6.1–8.1)

## 2019-12-05 LAB — LIPID PANEL
Cholesterol: 269 mg/dL — ABNORMAL HIGH (ref <200)
HDL: 52 mg/dL (ref >=50)
LDL Cholesterol (Calc): 192 mg/dL (calc) — ABNORMAL HIGH
Non-HDL Cholesterol (Calc): 217 mg/dL (calc) — ABNORMAL HIGH (ref <130)
Total CHOL/HDL Ratio: 5.2 (calc) — ABNORMAL HIGH (ref <5.0)
Triglycerides: 122 mg/dL (ref <150)

## 2019-12-05 LAB — CBC WITH DIFFERENTIAL/PLATELET
Absolute Monocytes: 459 cells/uL (ref 200–950)
Basophils Absolute: 82 cells/uL (ref 0–200)
Basophils Relative: 1 %
Eosinophils Absolute: 500 cells/uL (ref 15–500)
Eosinophils Relative: 6.1 %
HCT: 37.9 % (ref 35.0–45.0)
Hemoglobin: 12.8 g/dL (ref 11.7–15.5)
Lymphs Abs: 1935 cells/uL (ref 850–3900)
MCH: 29.6 pg (ref 27.0–33.0)
MCHC: 33.8 g/dL (ref 32.0–36.0)
MCV: 87.5 fL (ref 80.0–100.0)
MPV: 10.4 fL (ref 7.5–12.5)
Monocytes Relative: 5.6 %
Neutro Abs: 5223 cells/uL (ref 1500–7800)
Neutrophils Relative %: 63.7 %
Platelets: 234 10*3/uL (ref 140–400)
RBC: 4.33 10*6/uL (ref 3.80–5.10)
RDW: 12.9 % (ref 11.0–15.0)
Total Lymphocyte: 23.6 %
WBC: 8.2 10*3/uL (ref 3.8–10.8)

## 2019-12-05 LAB — TSH: TSH: 1.11 mIU/L

## 2019-12-08 LAB — CYTOLOGY - PAP
Comment: NEGATIVE
Diagnosis: NEGATIVE
Diagnosis: REACTIVE
High risk HPV: NEGATIVE

## 2020-02-12 DIAGNOSIS — Z1231 Encounter for screening mammogram for malignant neoplasm of breast: Secondary | ICD-10-CM | POA: Diagnosis not present

## 2020-02-12 DIAGNOSIS — Z1239 Encounter for other screening for malignant neoplasm of breast: Secondary | ICD-10-CM | POA: Diagnosis not present

## 2020-02-13 DIAGNOSIS — L821 Other seborrheic keratosis: Secondary | ICD-10-CM | POA: Diagnosis not present

## 2020-02-13 DIAGNOSIS — D225 Melanocytic nevi of trunk: Secondary | ICD-10-CM | POA: Diagnosis not present

## 2020-02-13 DIAGNOSIS — D2239 Melanocytic nevi of other parts of face: Secondary | ICD-10-CM | POA: Diagnosis not present

## 2020-02-13 DIAGNOSIS — L820 Inflamed seborrheic keratosis: Secondary | ICD-10-CM | POA: Diagnosis not present

## 2020-03-20 DIAGNOSIS — R928 Other abnormal and inconclusive findings on diagnostic imaging of breast: Secondary | ICD-10-CM | POA: Diagnosis not present

## 2020-08-19 DIAGNOSIS — H40013 Open angle with borderline findings, low risk, bilateral: Secondary | ICD-10-CM | POA: Diagnosis not present

## 2020-10-03 ENCOUNTER — Other Ambulatory Visit: Payer: Self-pay

## 2020-10-03 ENCOUNTER — Telehealth: Payer: BC Managed Care – PPO | Admitting: Family Medicine

## 2020-10-03 ENCOUNTER — Telehealth: Payer: Self-pay

## 2020-10-03 NOTE — Telephone Encounter (Signed)
Pt notified, Quarantine, OTC meds as needed.  If Rx needed or work note pt was told to schedule a virtual appt.

## 2020-10-03 NOTE — Telephone Encounter (Signed)
Copied from CRM [id number]. Topic: General - Other >> Oct 03, 2020 11:08 AM Dalphine Handing A wrote: Patient recently found out she was positive for covid and would like to speak with [facility] nurse on what she would advise her do do next and what she can take as far as medications. Please advise

## 2020-12-04 ENCOUNTER — Encounter: Payer: BC Managed Care – PPO | Admitting: Family Medicine

## 2021-04-07 ENCOUNTER — Telehealth: Payer: Self-pay

## 2021-04-07 ENCOUNTER — Other Ambulatory Visit: Payer: Self-pay

## 2021-04-07 ENCOUNTER — Ambulatory Visit (INDEPENDENT_AMBULATORY_CARE_PROVIDER_SITE_OTHER): Payer: BC Managed Care – PPO | Admitting: Family Medicine

## 2021-04-07 ENCOUNTER — Encounter: Payer: Self-pay | Admitting: Family Medicine

## 2021-04-07 VITALS — BP 118/70 | HR 90 | Temp 98.4°F | Resp 16 | Ht 63.0 in | Wt 186.5 lb

## 2021-04-07 DIAGNOSIS — E785 Hyperlipidemia, unspecified: Secondary | ICD-10-CM | POA: Diagnosis not present

## 2021-04-07 DIAGNOSIS — Z114 Encounter for screening for human immunodeficiency virus [HIV]: Secondary | ICD-10-CM | POA: Diagnosis not present

## 2021-04-07 DIAGNOSIS — Z130 Encounter for screening for diseases of the blood and blood-forming organs and certain disorders involving the immune mechanism: Secondary | ICD-10-CM

## 2021-04-07 DIAGNOSIS — Z6833 Body mass index (BMI) 33.0-33.9, adult: Secondary | ICD-10-CM

## 2021-04-07 DIAGNOSIS — Z1159 Encounter for screening for other viral diseases: Secondary | ICD-10-CM

## 2021-04-07 DIAGNOSIS — Z1231 Encounter for screening mammogram for malignant neoplasm of breast: Secondary | ICD-10-CM

## 2021-04-07 DIAGNOSIS — R600 Localized edema: Secondary | ICD-10-CM

## 2021-04-07 DIAGNOSIS — R635 Abnormal weight gain: Secondary | ICD-10-CM | POA: Diagnosis not present

## 2021-04-07 DIAGNOSIS — D6851 Activated protein C resistance: Secondary | ICD-10-CM

## 2021-04-07 DIAGNOSIS — Z8781 Personal history of (healed) traumatic fracture: Secondary | ICD-10-CM

## 2021-04-07 DIAGNOSIS — Z13220 Encounter for screening for lipoid disorders: Secondary | ICD-10-CM

## 2021-04-07 DIAGNOSIS — Z1211 Encounter for screening for malignant neoplasm of colon: Secondary | ICD-10-CM

## 2021-04-07 DIAGNOSIS — E669 Obesity, unspecified: Secondary | ICD-10-CM

## 2021-04-07 DIAGNOSIS — Z0000 Encounter for general adult medical examination without abnormal findings: Secondary | ICD-10-CM | POA: Diagnosis not present

## 2021-04-07 DIAGNOSIS — I872 Venous insufficiency (chronic) (peripheral): Secondary | ICD-10-CM

## 2021-04-07 DIAGNOSIS — M79605 Pain in left leg: Secondary | ICD-10-CM

## 2021-04-07 LAB — CBC WITH DIFFERENTIAL/PLATELET
MCH: 28.7 pg (ref 27.0–33.0)
MCV: 88.1 fL (ref 80.0–100.0)
Platelets: 267 10*3/uL (ref 140–400)
RDW: 12.9 % (ref 11.0–15.0)

## 2021-04-07 MED ORDER — PHENTERMINE HCL 37.5 MG PO TABS: 37.5000 mg | ORAL_TABLET | Freq: Every day | ORAL | 0 refills | Status: DC

## 2021-04-07 NOTE — Patient Instructions (Addendum)
Preventive Care 84-49 Years Old, Female Preventive care refers to lifestyle choices and visits with your health care provider that can promote health and wellness. This includes:  A yearly physical exam. This is also called an annual wellness visit.  Regular dental and eye exams.  Immunizations.  Screening for certain conditions.  Healthy lifestyle choices, such as: ? Eating a healthy diet. ? Getting regular exercise. ? Not using drugs or products that contain nicotine and tobacco. ? Limiting alcohol use. What can I expect for my preventive care visit? Physical exam Your health care provider will check your:  Height and weight. These may be used to calculate your BMI (body mass index). BMI is a measurement that tells if you are at a healthy weight.  Heart rate and blood pressure.  Body temperature.  Skin for abnormal spots. Counseling Your health care provider may ask you questions about your:  Past medical problems.  Family's medical history.  Alcohol, tobacco, and drug use.  Emotional well-being.  Home life and relationship well-being.  Sexual activity.  Diet, exercise, and sleep habits.  Work and work Statistician.  Access to firearms.  Method of birth control.  Menstrual cycle.  Pregnancy history. What immunizations do I need? Vaccines are usually given at various ages, according to a schedule. Your health care provider will recommend vaccines for you based on your age, medical history, and lifestyle or other factors, such as travel or where you work.   What tests do I need? Blood tests  Lipid and cholesterol levels. These may be checked every 5 years, or more often if you are over 3 years old.  Hepatitis C test.  Hepatitis B test. Screening  Lung cancer screening. You may have this screening every year starting at age 73 if you have a 30-pack-year history of smoking and currently smoke or have quit within the past 15 years.  Colorectal cancer  screening. ? All adults should have this screening starting at age 52 and continuing until age 17. ? Your health care provider may recommend screening at age 49 if you are at increased risk. ? You will have tests every 1-10 years, depending on your results and the type of screening test.  Diabetes screening. ? This is done by checking your blood sugar (glucose) after you have not eaten for a while (fasting). ? You may have this done every 1-3 years.  Mammogram. ? This may be done every 1-2 years. ? Talk with your health care provider about when you should start having regular mammograms. This may depend on whether you have a family history of breast cancer.  BRCA-related cancer screening. This may be done if you have a family history of breast, ovarian, tubal, or peritoneal cancers.  Pelvic exam and Pap test. ? This may be done every 3 years starting at age 10. ? Starting at age 11, this may be done every 5 years if you have a Pap test in combination with an HPV test. Other tests  STD (sexually transmitted disease) testing, if you are at risk.  Bone density scan. This is done to screen for osteoporosis. You may have this scan if you are at high risk for osteoporosis. Talk with your health care provider about your test results, treatment options, and if necessary, the need for more tests. Follow these instructions at home: Eating and drinking  Eat a diet that includes fresh fruits and vegetables, whole grains, lean protein, and low-fat dairy products.  Take vitamin and mineral supplements  as recommended by your health care provider.  Do not drink alcohol if: ? Your health care provider tells you not to drink. ? You are pregnant, may be pregnant, or are planning to become pregnant.  If you drink alcohol: ? Limit how much you have to 0-1 drink a day. ? Be aware of how much alcohol is in your drink. In the [place] one drink equals one 12 oz bottle of beer (355 mL), one 5 oz glass of  wine (148 mL), or one 1 oz glass of hard liquor (44 mL).   Lifestyle  Take daily care of your teeth and gums. Brush your teeth every morning and night with fluoride toothpaste. Floss one time each day.  Stay active. Exercise for at least 30 minutes 5 or more days each week.  Do not use any products that contain nicotine or tobacco, such as cigarettes, e-cigarettes, and chewing tobacco. If you need help quitting, ask your health care provider.  Do not use drugs.  If you are sexually active, practice safe sex. Use a condom or other form of protection to prevent STIs (sexually transmitted infections).  If you do not wish to become pregnant, use a form of birth control. If you plan to become pregnant, see your health care provider for a prepregnancy visit.  If told by your health care provider, take low-dose aspirin daily starting at age 50.  Find healthy ways to cope with stress, such as: ? Meditation, yoga, or listening to music. ? Journaling. ? Talking to a trusted person. ? Spending time with friends and family. Safety  Always wear your seat belt while driving or riding in a vehicle.  Do not drive: ? If you have been drinking alcohol. Do not ride with someone who has been drinking. ? When you are tired or distracted. ? While texting.  Wear a helmet and other protective equipment during sports activities.  If you have firearms in your house, make sure you follow all gun safety procedures. What's next?  Visit your health care provider once a year for an annual wellness visit.  Ask your health care provider how often you should have your eyes and teeth checked.  Stay up to date on all vaccines. This information is not intended to replace advice given to you by your health care provider. Make sure you discuss any questions you have with your health care provider. Document Revised: 09/17/2020 Document Reviewed: 08/25/2018 Elsevier Patient Education  2021 Elsevier Inc.  

## 2021-04-07 NOTE — Telephone Encounter (Signed)
Copied from CRM [id number]. Topic: General - Other >> Apr 07, 2021 11:02 AM Marylen Ponto wrote: Reason for CRM: Pt stated she had an appt today and she would like the Rx to be sent to [facility] [address] [place] - [address] since she was not given a prescription.   This is not a patient of [employer].

## 2021-04-07 NOTE — Progress Notes (Signed)
Patient: Angela Irwin, Female    DOB: Oct 07, 1971, 50 y.o.   MRN: [id number] Delsa Grana, PA-C Visit Date: 04/07/2021  Today's Provider: Delsa Grana, PA-C   Chief Complaint  Patient presents with  . Annual Exam   Subjective:   Annual physical exam:  Angela Irwin is a 50 y.o. female who presents today for complete physical exam:  Exercise/Activity:  5-8K steps a day and she goes to the gym 1-3x  Diet/nutrition:  Healthy, salads, veggies, limits snacks, tracks calories usually 1400-1600 cal daily Sleep: chronic insomnia, no change from her baseline  HX of HLD - not on meds The 10-year ASCVD risk score Mikey Bussing DC Jr., et al., 2013) is: 1.5%   Values used to calculate the score:     Age: 41 years     Sex: Female     Is Non-Hispanic African American: No     Diabetic: No     Tobacco smoker: No     Systolic Blood Pressure: 010 mmHg     Is BP treated: No     HDL Cholesterol: 52 mg/dL     Total Cholesterol: 269 mg/dL  Weight gain - more than 20 lbs - started after having COVID last year, exercises regularly and walks more than 5000 - 10000 steps daily + gym, eats only 1400-1600 cal, tracks daily, no other changes  Worse swelling to left leg with calf pain after exercise and left hip pain where she has chronic (20+ years) swelling, painful if she lays on her side.  Old injury and blood clots, no past vascular eval  USPSTF grade A and B recommendations - reviewed and addressed today  Depression:  Phq 9 completed today by patient, was reviewed by me with patient in the room PHQ score is neg, pt feels good PHQ 2/9 Scores 04/07/2021 12/04/2019 11/08/2019  PHQ - 2 Score 0 0 0  PHQ- 9 Score 0 2 4   Depression screen [facility] 2/9 04/07/2021 12/04/2019 11/08/2019  Decreased Interest 0 0 0  Down, Depressed, Hopeless 0 0 0  PHQ - 2 Score 0 0 0  Altered sleeping 0 2 3  Tired, decreased energy 0 0 1  Change in appetite 0 0 0  Feeling bad or failure about yourself  0 0 0  Trouble  concentrating 0 0 0  Moving slowly or fidgety/restless 0 0 0  Suicidal thoughts 0 0 0  PHQ-9 Score 0 2 4  Difficult doing work/chores Not difficult at all Not difficult at all Somewhat difficult    Alcohol screening: [place] Office Visit from 12/04/2019 in [facility]  AUDIT-C Score 0      Immunizations and Health Maintenance: Health Maintenance  Topic Date Due  . Hepatitis C Screening  Never done  . COLONOSCOPY (Pts 45-20yrs Insurance coverage will need to be confirmed)  Never done  . COVID-19 Vaccine (1) 04/23/2021 (Originally 08[birth year]14/1977)  . TETANUS/TDAP  04/07/2022 (Originally 08/15/1991)  . INFLUENZA VACCINE  07/28/2021  . PAP SMEAR-Modifier  12/03/2022  . HIV Screening  Completed  . HPV VACCINES  Aged Out     Hep C Screening: due  STD testing and prevention (HIV/chl/gon/syphilis):  see above, no additional testing desired by pt today - HIV screening due, low risk  Intimate partner violence:  denies  Sexual History/Pain during Intercourse:  Married - denies problems or concerns  Menstrual History/LMP/Abnormal Bleeding:  normal cycles, no AUB Patient's last menstrual period was 04/06/2021.  Incontinence Symptoms: denies  Breast cancer:  due Last Mammogram: *see HM list above BRCA gene screening:none known  Cervical cancer screening: UTD due 11/2022 Pt denies family hx of cancers - breast, ovarian, uterine, colon:     Osteoporosis:   Discussion on osteoporosis per age, including high calcium and vitamin D supplementation, weight bearing exercises  Skin cancer:  Hx of skin CA -  NO  Discussed atypical lesions   Colorectal cancer:   Colonoscopy is not  Due per age Discussed concerning signs and sx of CRC, pt denies   Lung cancer:   Low Dose CT Chest recommended if Age 43-80 years, 30 pack-year currently smoking OR have quit w/in 15years. Patient does not qualify.    Social History   Tobacco Use  . Smoking status: Former Research scientist (life sciences)  .  Smokeless tobacco: Never Used  Vaping Use  . Vaping Use: Never used  Substance Use Topics  . Alcohol use: Never  . Drug use: Not Currently     Flowsheet Row Office Visit from 12/04/2019 in [facility]  AUDIT-C Score 0      Family History  Problem Relation Age of Onset  . Heart attack Mother 19  . Hyperlipidemia Mother   . Hypertension Mother   . Kidney disease Mother   . Breast cancer Maternal Grandmother   . Lung cancer Maternal Grandfather   . Autism Brother      Blood pressure/Hypertension: BP Readings from Last 3 Encounters:  04/07/21 118/70  12/04/19 112/70  11/08/19 116/76    Weight/Obesity: Wt Readings from Last 3 Encounters:  04/07/21 186 lb 8 oz (84.6 kg)  12/04/19 167 lb 4.8 oz (75.9 kg)  11/08/19 162 lb 9.6 oz (73.8 kg)   BMI Readings from Last 3 Encounters:  04/07/21 33.04 kg/m  12/04/19 29.64 kg/m  11/08/19 28.80 kg/m     Lipids:  Lab Results  Component Value Date   CHOL 269 (H) 12/04/2019   Lab Results  Component Value Date   HDL 52 12/04/2019   Lab Results  Component Value Date   LDLCALC 192 (H) 12/04/2019   Lab Results  Component Value Date   TRIG 122 12/04/2019   Lab Results  Component Value Date   CHOLHDL 5.2 (H) 12/04/2019   No results found for: LDLDIRECT Based on the results of lipid panel his/her cardiovascular risk factor ( using [place] )  in the next 10 years is: The 10-year ASCVD risk score Mikey Bussing DC Brooke Bonito., et al., 2013) is: 1.5%   Values used to calculate the score:     Age: 5 years     Sex: Female     Is Non-Hispanic African American: No     Diabetic: No     Tobacco smoker: No     Systolic Blood Pressure: 628 mmHg     Is BP treated: No     HDL Cholesterol: 52 mg/dL     Total Cholesterol: 269 mg/dL Glucose:  Glucose, Bld  Date Value Ref Range Status  12/04/2019 94 65 - 99 mg/dL Final    Comment:    .            Fasting reference interval .   Advanced Care Planning:  A voluntary  discussion about advance care planning including the explanation and discussion of advance directives.     Social History      She        Social History   Socioeconomic History  . Marital status: Married  Spouse name: Darnelle Maffucci  . Number of children: 2  . Years of education: 34  . Highest education level: Some college, no degree  Occupational History  . Occupation: homemaker  Tobacco Use  . Smoking status: Former Research scientist (life sciences)  . Smokeless tobacco: Never Used  Vaping Use  . Vaping Use: Never used  Substance and Sexual Activity  . Alcohol use: Never  . Drug use: Not Currently  . Sexual activity: Yes    Comment: tubal  Other Topics Concern  . Not on file  Social History Narrative  . Not on file   Social Determinants of Health   Financial Resource Strain: Low Risk   . Difficulty of Paying Living Expenses: Not hard at all  Food Insecurity: No Food Insecurity  . Worried About Charity fundraiser in the Last Year: Never true  . Ran Out of Food in the Last Year: Never true  Transportation Needs: No Transportation Needs  . Lack of Transportation (Medical): No  . Lack of Transportation (Non-Medical): No  Physical Activity: Insufficiently Active  . Days of Exercise per Week: 2 days  . Minutes of Exercise per Session: 40 min  Stress: No Stress Concern Present  . Feeling of Stress : Not at all  Social Connections: Moderately Integrated  . Frequency of Communication with Friends and Family: Three times a week  . Frequency of Social Gatherings with Friends and Family: Twice a week  . Attends Religious Services: More than 4 times per year  . Active Member of Clubs or Organizations: No  . Attends Archivist Meetings: Never  . Marital Status: Married    Family History        Family History  Problem Relation Age of Onset  . Heart attack Mother 75  . Hyperlipidemia Mother   . Hypertension Mother   . Kidney disease Mother   . Breast cancer Maternal Grandmother   . Lung  cancer Maternal Grandfather   . Autism Brother     Patient Active Problem List   Diagnosis Date Noted  . Factor V Leiden ([place]) 11/10/2019  . Glaucoma of both eyes 11/10/2019  . Insomnia 11/10/2019  . 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 History of fracture of left ankle 11/10/2019  . MASTALGIA 05/27/2010  . BACK PAIN, LUMBAR, CHRONIC 05/27/2010    Past Surgical History:  Procedure Laterality Date  . DILATION AND CURETTAGE OF UTERUS      No current outpatient medications on file.  Allergies  Allergen Reactions  . Promethazine Hcl     REACTION: gi upset    Patient Care Team: Delsa Grana, PA-C as PCP - General (Family Medicine)  Review of Systems  Constitutional: Negative.  Negative for activity change, appetite change, fatigue and unexpected weight change.  HENT: Negative.   Eyes: Negative.   Respiratory: Negative.  Negative for shortness of breath.   Cardiovascular: Negative.  Negative for chest pain, palpitations and leg swelling.  Gastrointestinal: Negative.  Negative for abdominal pain and blood in stool.  Endocrine: Negative.   Genitourinary: Negative.   Musculoskeletal: Negative.  Negative for arthralgias, gait problem, joint swelling and myalgias.  Skin: Negative.  Negative for pallor and rash.  Allergic/Immunologic: Negative.   Neurological: Negative.  Negative for syncope and weakness.  Hematological: Negative.   Psychiatric/Behavioral: Negative.  Negative for dysphoric mood, self-injury and suicidal ideas. The patient is not nervous/anxious.   All other systems reviewed and are negative.    I  personally reviewed active problem list, medication list, allergies, family history, social history, health maintenance, notes from last encounter, lab results, imaging with the patient/caregiver today.        Objective:   Vitals:  Vitals:   04/07/21 0820  BP: 118/70  Pulse: 90  Resp: 16  Temp: 98.4 F (36.9 C)   SpO2: 98%  Weight: 186 lb 8 oz (84.6 kg)  Height: $Remove'5\' 3"'fDGSOKK$  (1.6 m)    Body mass index is 33.04 kg/m.  Physical Exam Vitals and nursing note reviewed.  Constitutional:      General: She is not in acute distress.    Appearance: Normal appearance. She is well-developed. She is obese. She is not ill-appearing, toxic-appearing or diaphoretic.     Interventions: Face mask in place.  HENT:     Head: Normocephalic and atraumatic.     Right Ear: Tympanic membrane, ear canal and external ear normal. There is no impacted cerumen.     Left Ear: Tympanic membrane, ear canal and external ear normal. There is no impacted cerumen.     Nose: Nose normal. No congestion.     Mouth/Throat:     Mouth: Mucous membranes are moist.     Pharynx: Oropharynx is clear. No oropharyngeal exudate or posterior oropharyngeal erythema.  Eyes:     General: Lids are normal. No scleral icterus.       Right eye: No discharge.        Left eye: No discharge.     Conjunctiva/sclera: Conjunctivae normal.     Pupils: Pupils are equal, round, and reactive to light.  Neck:     Thyroid: No thyroid mass or thyromegaly.     Trachea: Trachea and phonation normal. No tracheal deviation.  Cardiovascular:     Rate and Rhythm: Normal rate and regular rhythm.     Pulses: Normal pulses.          Radial pulses are 2+ on the right side and 2+ on the left side.       Posterior tibial pulses are 2+ on the right side and 2+ on the left side.     Heart sounds: Normal heart sounds. No murmur heard. No friction rub. No gallop.   Pulmonary:     Effort: Pulmonary effort is normal. No respiratory distress.     Breath sounds: Normal breath sounds. No stridor. No wheezing, rhonchi or rales.  Chest:     Chest wall: No tenderness.  Abdominal:     General: Bowel sounds are normal. There is no distension.     Palpations: Abdomen is soft.     Tenderness: There is no abdominal tenderness. There is no right CVA tenderness, left CVA tenderness or  guarding.  Musculoskeletal:     Cervical back: Normal range of motion and neck supple.     Right lower leg: No edema.     Left lower leg: No edema.     Comments: Left medial ankle slight abnormality at the medial malleolus but no current deformity or tenderness, large area of edematous tissue posterior and inferior to medial malleolus and numerous purple reticular veins to medial foot and ankle with nonpitting edema to the left ankle, foot and lower extremity, greater than the right.  Right medial foot very minimal if any reticular veins   Lymphadenopathy:     Cervical: No cervical adenopathy.  Skin:    General: Skin is warm and dry.     Capillary Refill: Capillary refill takes less than 2 seconds.  Coloration: Skin is not jaundiced or pale.     Findings: No rash.  Neurological:     Mental Status: She is alert. Mental status is at baseline.     Motor: No abnormal muscle tone.     Gait: Gait normal.  Psychiatric:        Mood and Affect: Mood normal.        Speech: Speech normal.        Behavior: Behavior normal.       Fall Risk: Fall Risk  04/07/2021 12/04/2019 11/08/2019  Falls in the past year? 0 0 1  Number falls in past yr: 0 0 1  Injury with Fall? 0 0 0    Functional Status Survey: Is the patient deaf or have difficulty hearing?: No Does the patient have difficulty seeing, even when wearing glasses/contacts?: No Does the patient have difficulty concentrating, remembering, or making decisions?: No Does the patient have difficulty walking or climbing stairs?: No Does the patient have difficulty dressing or bathing?: No Does the patient have difficulty doing errands alone such as visiting a doctor's office or shopping?: No   Assessment & Plan:    CPE completed today  . USPSTF grade A and B recommendations reviewed with patient; age-appropriate recommendations, preventive care, screening tests, etc discussed and encouraged; healthy living encouraged; see AVS for patient  education given to patient  . Discussed importance of 150 minutes of physical activity weekly, AHA exercise recommendations given to pt in AVS/handout  . Discussed importance of healthy diet:  eating lean meats and proteins, avoiding trans fats and saturated fats, avoid simple sugars and excessive carbs in diet, eat 6 servings of fruit/vegetables daily and drink plenty of water and avoid sweet beverages.    . Recommended pt to do annual eye exam and routine dental exams/cleanings  . Depression, alcohol, fall screening completed as documented above and per flowsheets  . Reviewed Health Maintenance: Health Maintenance  Topic Date Due  . Hepatitis C Screening  Never done  . COLONOSCOPY (Pts 45-1yrs Insurance coverage will need to be confirmed)  Never done  . COVID-19 Vaccine (1) 04/23/2021 (Originally 08/14/1977)  . TETANUS/TDAP  04/07/2022 (Originally 08/15/1991)  . INFLUENZA VACCINE  07/28/2021  . PAP SMEAR-Modifier  12/03/2022  . HIV Screening  Completed  . HPV VACCINES  Aged Out      ICD-10-CM   1. Adult general medical exam  Z00.00 Lipid panel    COMPLETE METABOLIC PANEL WITH GFR    CBC w/Diff/Platelet    MM 3D SCREEN BREAST BILATERAL    Ambulatory referral to Gastroenterology    TSH  2. Encounter for hepatitis C screening test for low risk patient  Z11.59 Hepatitis C Antibody  3. Screening for HIV without presence of risk factors  Z11.4 HIV antibody (with reflex)  4. Hyperlipidemia, unspecified hyperlipidemia type  E78.5 Lipid panel    COMPLETE METABOLIC PANEL WITH GFR  5. Encounter for screening mammogram for malignant neoplasm of breast  Z12.31 MM 3D SCREEN BREAST BILATERAL  6. Screening for colon cancer  Z12.11 Ambulatory referral to Gastroenterology  7. Unintended weight gain  R63.5 Lipid panel    COMPLETE METABOLIC PANEL WITH GFR    CBC w/Diff/Platelet    T4, free    TSH    phentermine (ADIPEX-P) 37.5 MG tablet   add on thyroid labs, unintentional weight gain with no  diet changes after having COVID last year, she would like to try phentermine or other meds  8. Factor V  Leiden [facility]) Chronic D68.51 CBC w/Diff/Platelet  9. Edema of left lower extremity  R60.0 Ambulatory referral to Vascular Surgery   ongoing for more than 28 years, fx and blood clots 21-28 years ago, no vascular eval, signs of venous insufficiency, doubt PAD, possibly lymphadema   10. Left leg pain  M79.605 Ambulatory referral to Vascular Surgery   diffuse LLE swelling and pain to calf after exercise, pain to left hip over greater trochanter with chronic swelling (lymphedema? vascular consults/testing)  11. Class 1 obesity with body mass index (BMI) of 33.0 to 33.9 in adult, unspecified obesity type, unspecified whether serious comorbidity present  E66.9 Lipid panel   A19.37 COMPLETE METABOLIC PANEL WITH GFR    CBC w/Diff/Platelet    T4, free    TSH    phentermine (ADIPEX-P) 37.5 MG tablet   over 20 lbs weight gain with no diet or exercise changes, screen for hypothyroid, trial phentermine, f/up appt in 1 months, may need med weight management  12. 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 History of fracture of left ankle  Z87.81 Ambulatory referral to Vascular Surgery   One month f/up on weight - review labs, pt encouraged to check w/ insurance about saxenda coverage, may want to consider medical weight management referral if no abnormal labs and if she continues to gain weight.    Delsa Grana, PA-C 04/07/21 8:44 AM  [facility]

## 2021-04-08 LAB — COMPLETE METABOLIC PANEL WITH GFR
AG Ratio: 1.6 (calc) (ref 1.0–2.5)
ALT: 13 U/L (ref 6–29)
AST: 15 U/L (ref 10–35)
Albumin: 4.3 g/dL (ref 3.6–5.1)
Alkaline phosphatase (APISO): 41 U/L (ref 31–125)
BUN: 14 mg/dL (ref 7–25)
CO2: 28 mmol/L (ref 20–32)
Calcium: 9.2 mg/dL (ref 8.6–10.2)
Chloride: 104 mmol/L (ref 98–110)
Creat: 0.77 mg/dL (ref 0.50–1.10)
GFR, Est African American: 106 mL/min/{1.73_m2} (ref >=60)
GFR, Est Non African American: 91 mL/min/{1.73_m2} (ref >=60)
Globulin: 2.7 g/dL (calc) (ref 1.9–3.7)
Glucose, Bld: 94 mg/dL (ref 65–99)
Potassium: 4.8 mmol/L (ref 3.5–5.3)
Sodium: 139 mmol/L (ref 135–146)
Total Bilirubin: 0.4 mg/dL (ref 0.2–1.2)
Total Protein: 7 g/dL (ref 6.1–8.1)

## 2021-04-08 LAB — T4, FREE: Free T4: 1.3 ng/dL (ref 0.8–1.8)

## 2021-04-08 LAB — LIPID PANEL
Cholesterol: 252 mg/dL — ABNORMAL HIGH (ref <200)
HDL: 57 mg/dL (ref >=50)
LDL Cholesterol (Calc): 175 mg/dL (calc) — ABNORMAL HIGH
Non-HDL Cholesterol (Calc): 195 mg/dL (calc) — ABNORMAL HIGH (ref <130)
Total CHOL/HDL Ratio: 4.4 (calc) (ref <5.0)
Triglycerides: 86 mg/dL (ref <150)

## 2021-04-08 LAB — CBC WITH DIFFERENTIAL/PLATELET
Absolute Monocytes: 570 cells/uL (ref 200–950)
Basophils Absolute: 107 cells/uL (ref 0–200)
Basophils Relative: 1.6 %
Eosinophils Absolute: 623 cells/uL — ABNORMAL HIGH (ref 15–500)
Eosinophils Relative: 9.3 %
HCT: 39.9 % (ref 35.0–45.0)
Hemoglobin: 13 g/dL (ref 11.7–15.5)
Lymphs Abs: 1266 cells/uL (ref 850–3900)
MCHC: 32.6 g/dL (ref 32.0–36.0)
MPV: 9.9 fL (ref 7.5–12.5)
Monocytes Relative: 8.5 %
Neutro Abs: 4134 cells/uL (ref 1500–7800)
Neutrophils Relative %: 61.7 %
RBC: 4.53 10*6/uL (ref 3.80–5.10)
Total Lymphocyte: 18.9 %
WBC: 6.7 10*3/uL (ref 3.8–10.8)

## 2021-04-08 LAB — HEPATITIS C ANTIBODY
Hepatitis C Ab: NONREACTIVE
SIGNAL TO CUT-OFF: 0.01 (ref <1.00)

## 2021-04-08 LAB — HIV ANTIBODY (ROUTINE TESTING W REFLEX): HIV 1&2 Ab, 4th Generation: NONREACTIVE

## 2021-04-08 LAB — TSH: TSH: 1.51 mIU/L

## 2021-04-15 ENCOUNTER — Encounter: Payer: Self-pay | Admitting: Family Medicine

## 2021-04-15 DIAGNOSIS — Z5181 Encounter for therapeutic drug level monitoring: Secondary | ICD-10-CM

## 2021-04-15 DIAGNOSIS — E785 Hyperlipidemia, unspecified: Secondary | ICD-10-CM

## 2021-04-15 MED ORDER — ATORVASTATIN CALCIUM 10 MG PO TABS: 10.0000 mg | ORAL_TABLET | Freq: Every day | ORAL | 3 refills | Status: DC

## 2021-04-26 ENCOUNTER — Other Ambulatory Visit: Payer: Self-pay

## 2021-04-26 DIAGNOSIS — I872 Venous insufficiency (chronic) (peripheral): Secondary | ICD-10-CM

## 2021-04-30 ENCOUNTER — Telehealth (INDEPENDENT_AMBULATORY_CARE_PROVIDER_SITE_OTHER): Payer: Self-pay | Admitting: Gastroenterology

## 2021-04-30 ENCOUNTER — Other Ambulatory Visit: Payer: Self-pay

## 2021-04-30 DIAGNOSIS — Z1211 Encounter for screening for malignant neoplasm of colon: Secondary | ICD-10-CM

## 2021-04-30 MED ORDER — PEG 3350-KCL-NA BICARB-NACL 420 G PO SOLR: 4000.0000 mL | Freq: Once | ORAL | 0 refills | Status: AC

## 2021-04-30 NOTE — Progress Notes (Signed)
Gastroenterology Pre-Procedure Review  Request Date: 05/30/21 Requesting Physician: Dr. Maximino Greenland  PATIENT REVIEW QUESTIONS: The patient responded to the following health history questions as indicated:    1. Are you having any GI issues? no 2. Do you have a personal history of Polyps? no 3. Do you have a family history of Colon Cancer or Polyps? no 4. Diabetes Mellitus? no 5. Joint replacements in the past 12 months?no 6. Major health problems in the past 3 months?no 7. Any artificial heart valves, MVP, or defibrillator?no    MEDICATIONS & ALLERGIES:    Patient reports the following regarding taking any anticoagulation/antiplatelet therapy:   Plavix, Coumadin, Eliquis, Xarelto, Lovenox, Pradaxa, Brilinta, or Effient? no Aspirin? no  Patient confirms/reports the following medications:  Current Outpatient Medications  Medication Sig Dispense Refill  . atorvastatin (LIPITOR) 10 MG tablet Take 1 tablet (10 mg total) by mouth daily. 90 tablet 3  . phentermine (ADIPEX-P) 37.5 MG tablet Take 1 tablet (37.5 mg total) by mouth daily before breakfast. 30 tablet 0  . polyethylene glycol-electrolytes (NULYTELY) 420 g solution Take 4,000 mLs by mouth once for 1 dose. Fill container to the fill line with clear liquid.  Mix well.  Drink 8 oz every 30 minutes until entire contents have been completed. 4000 mL 0   No current facility-administered medications for this visit.    Patient confirms/reports the following allergies:  Allergies  Allergen Reactions  . Promethazine Hcl     REACTION: gi upset    Orders Placed This Encounter  Procedures  . Procedural/ Surgical Case Request: COLONOSCOPY WITH PROPOFOL    Standing Status:   Standing    Number of Occurrences:   1    Order Specific Question:   Pre-op diagnosis    Answer:   screening colonoscopy    Order Specific Question:   CPT Code    Answer:   07371    AUTHORIZATION INFORMATION Primary Insurance: 1D#: Group #:  Secondary  Insurance: 1D#: Group #:  SCHEDULE INFORMATION: Date: 05/30/21 Time: Location:[facility]

## 2021-05-06 ENCOUNTER — Ambulatory Visit (INDEPENDENT_AMBULATORY_CARE_PROVIDER_SITE_OTHER): Payer: BC Managed Care – PPO | Admitting: Physician Assistant

## 2021-05-06 ENCOUNTER — Other Ambulatory Visit: Payer: Self-pay

## 2021-05-06 ENCOUNTER — Ambulatory Visit (HOSPITAL_COMMUNITY)
Admission: RE | Admit: 2021-05-06 | Discharge: 2021-05-06 | Disposition: A | Discharge status: Home / Self Care | Payer: BC Managed Care – PPO | Source: Ambulatory Visit | Attending: Vascular Surgery | Admitting: Vascular Surgery

## 2021-05-06 VITALS — BP 125/74 | HR 83 | Temp 98.3°F | Resp 20 | Ht 63.0 in | Wt 180.2 lb

## 2021-05-06 DIAGNOSIS — R600 Localized edema: Secondary | ICD-10-CM | POA: Diagnosis not present

## 2021-05-06 DIAGNOSIS — M7989 Other specified soft tissue disorders: Secondary | ICD-10-CM

## 2021-05-06 DIAGNOSIS — I872 Venous insufficiency (chronic) (peripheral): Secondary | ICD-10-CM

## 2021-05-06 NOTE — Progress Notes (Signed)
[facility]           [facility] [place]  History and Physical   Angela Irwin is a 50 y.o. female who presents with LLE for many years.  She states that about 27 years ago, she was pregnant and had an ankle fracture.  She did not have surgery for this and it healed wrong.  She has had swelling ever since then.  She tells me that she did have hx of blood clots in the past when she was pregnant.  She had been on coumadin at one time.  She is from [place], [place] and she saw a hematologist many years ago and was diagnosed with Factor 5 Leiden.  The hematologist recommended taking a daily asa but she did not do this.  She is not currently on blood thinners but recently started taking a daily asa.  She was on coumadin many years ago as well as Lovenox. She states that she recently started getting more swelling in her left calf after exercise.  She also has some swelling in her thigh and it is sometimes painful to lay on that side at night and she will have to adjust.  She has not worn compression.  She does occasionally elevate her legs.  She does put them up in the recliner so they are not hanging in the dependent position.  She does not have family hx of varicose veins.    She has a remote hx of smoking but quit about 10 years ago.  She has never had an MI or stroke or afib.   She moved from [place] to various locations due to her husband's job at [employer].   There is no family hx of AAA.  The pt is on a statin for cholesterol management.  The pt is on a daily aspirin.   Other AC:  none The pt is not on medication for hypertension.   The pt is not diabetic.      Past Medical History:  Diagnosis Date  . Factor 5 Leiden mutation, heterozygous (HCC)   . Glaucoma     Past Surgical History:  Procedure Laterality Date  . DILATION AND CURETTAGE OF UTERUS      Social History   Socioeconomic History  . Marital status: Married    Spouse name: Feliz Beam  . Number of  children: 2  . Years of education: 41  . Highest education level: Some college, no degree  Occupational History  . Occupation: homemaker  Tobacco Use  . Smoking status: Former Games developer  . Smokeless tobacco: Never Used  Vaping Use  . Vaping Use: Never used  Substance and Sexual Activity  . Alcohol use: Never  . Drug use: Not Currently  . Sexual activity: Yes    Comment: tubal  Other Topics Concern  . Not on file  Social History Narrative  . Not on file   Social Determinants of Health   Financial Resource Strain: Low Risk   . Difficulty of Paying Living Expenses: Not hard at all  Food Insecurity: No Food Insecurity  . Worried About Programme researcher, broadcasting/film/video in the Last Year: Never true  . Ran Out of Food in the Last Year: Never true  Transportation Needs: No Transportation Needs  . Lack of Transportation (Medical): No  . Lack of Transportation (Non-Medical): No  Physical Activity: Insufficiently Active  . Days of Exercise per Week: 2 days  . Minutes of  Exercise per Session: 40 min  Stress: No Stress Concern Present  . Feeling of Stress : Not at all  Social Connections: Moderately Integrated  . Frequency of Communication with Friends and Family: Three times a week  . Frequency of Social Gatherings with Friends and Family: Twice a week  . Attends Religious Services: More than 4 times per year  . Active Member of Clubs or Organizations: No  . Attends Banker Meetings: Never  . Marital Status: Married  Catering manager Violence: Not At Risk  . Fear of Current or Ex-Partner: No  . Emotionally Abused: No  . Physically Abused: No  . Sexually Abused: No     Family History  Problem Relation Age of Onset  . Heart attack Mother 25  . Hyperlipidemia Mother   . Hypertension Mother   . Kidney disease Mother   . Breast cancer Maternal Grandmother   . Lung cancer Maternal Grandfather   . Autism Brother     Current Outpatient Medications  Medication Sig Dispense Refill   . atorvastatin (LIPITOR) 10 MG tablet Take 1 tablet (10 mg total) by mouth daily. 90 tablet 3  . phentermine (ADIPEX-P) 37.5 MG tablet Take 1 tablet (37.5 mg total) by mouth daily before breakfast. 30 tablet 0   No current facility-administered medications for this visit.    Allergies  Allergen Reactions  . Promethazine Hcl     REACTION: gi upset    REVIEW OF SYSTEMS:   [X]  denotes positive finding, [ ]  denotes negative finding Cardiac  Comments:  Chest pain or chest pressure:    Shortness of breath upon exertion:    Short of breath when lying flat:    Irregular heart rhythm:        Vascular    Pain in calf, thigh, or hip brought on by ambulation:    Pain in feet at night that wakes you up from your sleep:     Blood clot in your veins: x   Leg swelling:  x       Pulmonary    Oxygen at home:    Productive cough:     Wheezing:         Neurologic    Sudden weakness in arms or legs:     Sudden numbness in arms or legs:     Sudden onset of difficulty speaking or slurred speech:    Temporary loss of vision in one eye:     Problems with dizziness:         Gastrointestinal    Blood in stool:     Vomited blood:         Genitourinary    Burning when urinating:     Blood in urine:        Psychiatric    Major depression:         Hematologic    Bleeding problems:    Problems with blood clotting too easily:        Skin    Rashes or ulcers:        Constitutional    Fever or chills:      PHYSICAL EXAMINATION:  Today's Vitals   05/06/21 1439  BP: 125/74  Pulse: 83  Resp: 20  Temp: 98.3 F (36.8 C)  TempSrc: Temporal  SpO2: 98%  Weight: 180 lb 3.2 oz (81.7 kg)  Height: 5\' 3"  (1.6 m)  PainSc: 4    Body mass index is 31.92 kg/m.   General:  WDWN  in NAD; vital signs documented above Gait: Not observed HENT: WNL, normocephalic Pulmonary: normal non-labored breathing without wheezing Cardiac: regular HR; without carotid bruits Abdomen: soft, NT, no  masses; aortic pulse is not palpable Skin: without rashes Vascular Exam/Pulses:  Right Left  Radial 2+ (normal) 2+ (normal)  DP 2+ (normal) 2+ (normal)  PT Unable to palpate Unable to palpate   Extremities: without ischemic changes, without cellulitis; without open wounds; +swelling LLE Musculoskeletal: no muscle wasting or atrophy  Neurologic: A&O X 3;  moving all extremities equally Psychiatric:  The pt has Normal affect.   Non-Invasive Vascular Imaging:   Venous duplex on 05/06/2021: LEFT     Reflux NoReflux Reflux Diameter cmsComments                        Yes  Time                     +-------------+---------+------+---------+------------+--------------------  CFV           yes >1 second      chronic thrombus   stranding       +-------------+---------+------+---------+------------+--------------------  FV mid          yes >1 second                  +-------------+---------+------+---------+------------+--------------------  Popliteal        yes >1 second                  +-------------+---------+------+---------+------------+--------------------  GSV at SFJ        yes  >500 ms   0.5   chronic thrombus stranding       +-------------+---------+------+---------+------------+--------------------   GSV prox         yes  >500 ms   0.46               thigh                                    +-------------+---------+------+---------+------------+--------------------  GSV mid thigh      yes  >500 ms   0.48              +-------------+---------+------+---------+------------+--------------------  GSV dist         yes  >500 ms   0.44               thigh                                    +-------------+---------+------+---------+------------+--------------------  GSV at knee       yes  >500 ms   0.51              +-------------+---------+------+---------+------------+--------------------  GSV prox calf      yes  >500 ms   0.42              +-------------+---------+------+---------+------------+--------------------  SSV Pop Fossano              0.31              +-------------+---------+------+---------+------------+--------------------  SSV prox calfno              0.36              +-------------+---------+------+---------+------------+--------------------  SSV mid calf no  0.3               +-------------+---------+------+---------+------------+--------------------  AASV PROX        yes  >500 ms   0.43              +-------------+---------+------+---------+------------+--------------------   AASV MID         yes  >500 ms   0.37              +-------------+---------+------+---------+------------+--------------------    Summary:  Left:  - Venous reflux is noted in the left common femoral vein.  - Venous reflux is noted in the left sapheno-femoral junction.  - Venous reflux is noted in the left greater saphenous vein in the thigh.  - Venous reflux is noted in the left greater saphenous vein in the calf.  - Venous reflux is noted in the left femoral vein.  - Venous reflux is noted in the left popliteal vein.  - Venous reflux is noted in the left AASV.  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    Magnus IvanLisa A Irwin is a 50 y.o. female who presents with: chronic LLE swelling  Pt has palpable DP pulses bilaterally.  She does have hx of DVT and there [facility]  It would be difficult to know how  long this has been present.  She does not have any acute clot on u/s today.  -pt does have hx of Factor V Leiden and [facility]  Will defer to her PCP for this.  She has seen hematologist in the past that recommended asa.  I offered referral to hematologist but she does not want to do this at this time.   -discussed with pt about wearing thigh high 20-7430mmHg compression stockings as she does have reflux in the GSV.  Discussed with pt that she would need to f/u with MD in 3 months to determine if she is a candidate.  She was measured for these today.  -also discussed with her that if she travels, she should wear compression and take frequent breaks to ambulate.   -discussed the importance of leg elevation and how to elevate properly - pt is advised to elevate their legs and a diagram is given to them to demonstrate to lay flat on their back with knees elevated and slightly bent with their feet higher than her knees, which puts their feet higher than their heart for 15 minutes per day.  If they cannot lay flat, advised to lay as flat as possible.  -pt is advised to continue as much walking as possible and avoid sitting or standing for long periods of time.  -discussed importance of weight loss and exercise and that water aerobics would also be beneficial.  -handout with recommendations given -pt will call if she would like to be considered for laser ablation.    Doreatha MassedSamantha Bo Rogue, [facility] 05/06/2021 3:20 PM  Clinic MD:  Chestine Sporelark

## 2021-05-09 ENCOUNTER — Other Ambulatory Visit: Payer: Self-pay

## 2021-05-09 ENCOUNTER — Encounter: Payer: Self-pay | Admitting: Family Medicine

## 2021-05-09 ENCOUNTER — Ambulatory Visit (INDEPENDENT_AMBULATORY_CARE_PROVIDER_SITE_OTHER): Payer: BC Managed Care – PPO | Admitting: Family Medicine

## 2021-05-09 VITALS — BP 110/64 | HR 98 | Temp 98.1°F | Resp 16 | Ht 63.0 in | Wt 178.5 lb

## 2021-05-09 DIAGNOSIS — E66811 Obesity, class 1: Secondary | ICD-10-CM

## 2021-05-09 DIAGNOSIS — Z6833 Body mass index (BMI) 33.0-33.9, adult: Secondary | ICD-10-CM

## 2021-05-09 DIAGNOSIS — E785 Hyperlipidemia, unspecified: Secondary | ICD-10-CM | POA: Diagnosis not present

## 2021-05-09 DIAGNOSIS — Z5181 Encounter for therapeutic drug level monitoring: Secondary | ICD-10-CM

## 2021-05-09 DIAGNOSIS — I824Z2 Acute embolism and thrombosis of unspecified deep veins of left distal lower extremity: Secondary | ICD-10-CM | POA: Insufficient documentation

## 2021-05-09 DIAGNOSIS — I872 Venous insufficiency (chronic) (peripheral): Secondary | ICD-10-CM

## 2021-05-09 DIAGNOSIS — D6851 Activated protein C resistance: Secondary | ICD-10-CM

## 2021-05-09 DIAGNOSIS — E669 Obesity, unspecified: Secondary | ICD-10-CM

## 2021-05-09 DIAGNOSIS — R635 Abnormal weight gain: Secondary | ICD-10-CM | POA: Insufficient documentation

## 2021-05-09 DIAGNOSIS — D689 Coagulation defect, unspecified: Secondary | ICD-10-CM

## 2021-05-09 DIAGNOSIS — R600 Localized edema: Secondary | ICD-10-CM

## 2021-05-09 DIAGNOSIS — M79662 Pain in left lower leg: Secondary | ICD-10-CM

## 2021-05-09 DIAGNOSIS — I82402 Acute embolism and thrombosis of unspecified deep veins of left lower extremity: Secondary | ICD-10-CM | POA: Insufficient documentation

## 2021-05-09 MED ORDER — RIVAROXABAN 15 MG PO TABS: 15.0000 mg | ORAL_TABLET | Freq: Two times a day (BID) | ORAL | 0 refills | Status: DC

## 2021-05-09 MED ORDER — PHENTERMINE HCL 37.5 MG PO TABS: 37.5000 mg | ORAL_TABLET | Freq: Every day | ORAL | 0 refills | Status: DC

## 2021-05-09 MED ORDER — RIVAROXABAN 20 MG PO TABS: 20.0000 mg | ORAL_TABLET | Freq: Every day | ORAL | 3 refills | Status: DC

## 2021-05-09 NOTE — Progress Notes (Signed)
Name: ROSHAN Irwin   MRN: [id number]    DOB: Oct 28, 1971   Date:05/09/2021       Progress Note  Chief Complaint  Patient presents with  . Obesity     Subjective:   Angela Irwin is a 50 y.o. female, presents to clinic for weight check/weight management and med f/up Also to review labs   Weight down - 8 lbs Trying to eat oatmeal in as  Wt Readings from Last 5 Encounters:  05/09/21 178 lb 8 oz (81 kg)  05/06/21 180 lb 3.2 oz (81.7 kg)  04/07/21 186 lb 8 oz (84.6 kg)  12/04/19 167 lb 4.8 oz (75.9 kg)  11/08/19 162 lb 9.6 oz (73.8 kg)   BMI Readings from Last 5 Encounters:  05/09/21 31.62 kg/m  05/06/21 31.92 kg/m  04/07/21 33.04 kg/m  12/04/19 29.64 kg/m  11/08/19 28.80 kg/m   BP Readings from Last 3 Encounters:  05/09/21 110/64  05/06/21 125/74  04/07/21 118/70   Pulse Readings from Last 3 Encounters:  05/09/21 98  05/06/21 83  04/07/21 90   Tolerating well w/o CP, HA, abd pain, anxiety, insomnia  Controlled substance database reviewed - phentermine filled on 4/11  Cholesterol very high, blood sugar normal, TSH and T4 normal Results for orders placed or performed in visit on 04/07/21  Lipid panel  Result Value Ref Range   Cholesterol 252 (H) <200 mg/dL   HDL 57 > OR = 50 mg/dL   Triglycerides 86 <295 mg/dL   LDL Cholesterol (Calc) 175 (H) mg/dL (calc)   Total CHOL/HDL Ratio 4.4 <5.0 (calc)   Non-HDL Cholesterol (Calc) 195 (H) <130 mg/dL (calc)  COMPLETE METABOLIC PANEL WITH GFR  Result Value Ref Range   Glucose, Bld 94 65 - 99 mg/dL   BUN 14 7 - 25 mg/dL   Creat 2.84 1.32 - 4.40 mg/dL   GFR, Est Non African American 91 > OR = 60 mL/min/1.75m2   GFR, Est African American 106 > OR = 60 mL/min/1.6m2   BUN/Creatinine Ratio NOT APPLICABLE 6 - 22 (calc)   Sodium 139 135 - 146 mmol/L   Potassium 4.8 3.5 - 5.3 mmol/L   Chloride 104 98 - 110 mmol/L   CO2 28 20 - 32 mmol/L   Calcium 9.2 8.6 - 10.2 mg/dL   Total Protein 7.0 6.1 - 8.1 g/dL   Albumin  4.3 3.6 - 5.1 g/dL   Globulin 2.7 1.9 - 3.7 g/dL (calc)   AG Ratio 1.6 1.0 - 2.5 (calc)   Total Bilirubin 0.4 0.2 - 1.2 mg/dL   Alkaline phosphatase (APISO) 41 31 - 125 U/L   AST 15 10 - 35 U/L   ALT 13 6 - 29 U/L  CBC w/Diff/Platelet  Result Value Ref Range   WBC 6.7 3.8 - 10.8 Thousand/uL   RBC 4.53 3.80 - 5.10 Million/uL   Hemoglobin 13.0 11.7 - 15.5 g/dL   HCT 10.2 72.5 - 36.6 %   MCV 88.1 80.0 - 100.0 fL   MCH 28.7 27.0 - 33.0 pg   MCHC 32.6 32.0 - 36.0 g/dL   RDW 44.0 34.7 - 42.5 %   Platelets 267 140 - 400 Thousand/uL   MPV 9.9 7.5 - 12.5 fL   Neutro Abs 4,134 1,500 - 7,800 cells/uL   Lymphs Abs 1,266 850 - 3,900 cells/uL   Absolute Monocytes 570 200 - 950 cells/uL   Eosinophils Absolute 623 (H) 15 - 500 cells/uL   Basophils Absolute 107 0 - 200  cells/uL   Neutrophils Relative % 61.7 %   Total Lymphocyte 18.9 %   Monocytes Relative 8.5 %   Eosinophils Relative 9.3 %   Basophils Relative 1.6 %  Hepatitis C Antibody  Result Value Ref Range   Hepatitis C Ab NON-REACTIVE NON-REACTI   SIGNAL TO CUT-OFF 0.01 <1.00  HIV antibody (with reflex)  Result Value Ref Range   HIV 1&2 Ab, 4th Generation NON-REACTIVE NON-REACTI  TSH  Result Value Ref Range   TSH 1.51 mIU/L  T4, free  Result Value Ref Range   Free T4 1.3 0.8 - 1.8 ng/dL   Reviewed recent visit, procedure, recommendations from Vascular speciaslists - 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     Current Outpatient Medications:  .  phentermine (ADIPEX-P) 37.5 MG tablet, Take 1 tablet (37.5 mg total) by mouth daily before breakfast., Disp: 30 tablet, Rfl: 0 .  atorvastatin (LIPITOR) 10 MG tablet, Take 1 tablet (10 mg total) by mouth daily. (Patient not taking: Reported on 05/09/2021), Disp: 90 tablet, Rfl: 3  Patient Active Problem List   Diagnosis Date Noted  . Deep vein thrombosis (DVT) of distal vein of left lower  extremity (HCC) 05/09/2021  . 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 Coagulopathy (HCC) 05/09/2021  . Factor V Leiden (HCC) 11/10/2019  . Glaucoma of both eyes 11/10/2019  . Insomnia 11/10/2019  . 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 History of fracture of left ankle 11/10/2019  . MASTALGIA 05/27/2010  . BACK PAIN, LUMBAR, CHRONIC 05/27/2010    Past Surgical History:  Procedure Laterality Date  . DILATION AND CURETTAGE OF UTERUS      Family History  Problem Relation Age of Onset  . Heart attack Mother [id number]  . Hyperlipidemia Mother   . Hypertension Mother   . Kidney disease Mother   . Breast cancer Maternal Grandmother   . Lung cancer Maternal Grandfather   . Autism Brother     Social History   Tobacco Use  . Smoking status: Former Games developermoker  . Smokeless tobacco: Never Used  Vaping Use  . Vaping Use: Never used  Substance Use Topics  . Alcohol use: Never  . Drug use: Not Currently     Allergies  Allergen Reactions  . Promethazine Hcl     REACTION: gi upset    Health Maintenance  Topic Date Due  . MAMMOGRAM  Never done  . COLONOSCOPY (Pts [id number] Insurance coverage will need to be confirmed)  Never done  . COVID-19 Vaccine (1) 05/25/2021 (Originally 08/14/1976)  . TETANUS/TDAP  04/07/2022 (Originally 08/15/1991)  . INFLUENZA VACCINE  07/28/2021  . PAP SMEAR-Modifier  12/03/2022  . Hepatitis C Screening  Completed  . HIV Screening  Completed  . HPV VACCINES  Aged Out    Chart Review Today: I personally reviewed active problem list, medication list, allergies, family history, social history, health maintenance, notes from last encounter, lab results, imaging with the patient/caregiver today.   Review of Systems  All other Systems reviewed and are negative for acute change except as noted in the HPI.  Objective:   Vitals:   05/09/21 1417  BP: 110/64  Pulse: 98  Resp: 16   Temp: 98.1 F (36.7 C)  SpO2: 96%  Weight: 178 lb 8 oz (81 kg)  Height: 5\' 3"  (1.6 m)  Body mass index is 31.62 kg/m.  Physical Exam Vitals and nursing note reviewed.  Constitutional:      General: She is not in acute distress.    Appearance: Normal appearance. She is well-developed. She is obese. She is not ill-appearing, toxic-appearing or diaphoretic.     Interventions: Face mask in place.  HENT:     Head: Normocephalic and atraumatic.     Right Ear: External ear normal.     Left Ear: External ear normal.  Eyes:     General: Lids are normal. No scleral icterus.       Right eye: No discharge.        Left eye: No discharge.     Conjunctiva/sclera: Conjunctivae normal.  Neck:     Trachea: Phonation normal. No tracheal deviation.  Cardiovascular:     Rate and Rhythm: Normal rate and regular rhythm.     Pulses: Normal pulses.          Radial pulses are 2+ on the right side and 2+ on the left side.     Heart sounds: Normal heart sounds. No murmur heard. No friction rub. No gallop.      Comments: Nonpitting LLE edema genealized with varicosities/reticular veins Pulmonary:     Effort: Pulmonary effort is normal. No respiratory distress.     Breath sounds: Normal breath sounds. No stridor. No wheezing, rhonchi or rales.  Chest:     Chest wall: No tenderness.  Abdominal:     General: Bowel sounds are normal. There is no distension.     Palpations: Abdomen is soft.  Musculoskeletal:     Right lower leg: No edema.     Left lower leg: Edema present.  Skin:    General: Skin is warm and dry.     Coloration: Skin is not jaundiced or pale.     Findings: No rash.  Neurological:     Mental Status: She is alert.     Motor: No abnormal muscle tone.     Gait: Gait normal.  Psychiatric:        Mood and Affect: Mood normal.        Speech: Speech normal.        Behavior: Behavior normal.         Assessment & Plan:     ICD-10-CM   1. Class 1 obesity with body mass index  (BMI) of 33.0 to 33.9 in adult, unspecified obesity type, unspecified whether serious comorbidity present  E66.9 phentermine (ADIPEX-P) 37.5 MG tablet   Z68.33 phentermine (ADIPEX-P) 37.5 MG tablet   over 20 lbs weight gain with no diet or exercise changes, screen for hypothyroid, trial phentermine, f/up appt in 1 months, may need med weight management  2. Unintended weight gain  R63.5 phentermine (ADIPEX-P) 37.5 MG tablet   thyroid normal, blood sugar normal - unclear if possible metabolic syndrome - discussed could do A1C and insulin levels, refer to dietician  3. Hyperlipidemia, unspecified hyperlipidemia type  E78.5    very elevated, recommended statin, she did not start - discussed med benefits/risks/SE she will recheck labs in 3 months and then take statin if still high  4. Encounter for medication monitoring  Z51.81   5. 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 Deep vein thrombosis (DVT) of distal vein of left lower extremity, unspecified chronicity (HCC)  I82.4Z2 Rivaroxaban (XARELTO) 15 MG TABS tablet  rivaroxaban (XARELTO) 20 MG TABS tablet   Chronic thrombus noted in the left CFV and SFJ. per vascular, recommend NOAC to manage, hematology referral for eval  7. Factor V Leiden (HCC)  D68.51 Rivaroxaban (XARELTO) 15 MG TABS tablet    rivaroxaban (XARELTO) 20 MG TABS tablet   hx of DVT, recent study showed chronic thrombus CFV SFJ - previously declined anticoagulation and hematology referral  8. Coagulopathy (HCC)  D68.9 Rivaroxaban (XARELTO) 15 MG TABS tablet    rivaroxaban (XARELTO) 20 MG TABS tablet   see above  9. 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 Pain of left calf  M79.662    though she has had LLE edema for years, pain in calf was new and worse and I suspect DVT is more recent and not chronic for years     F/up on left leg swelling and pain, worsening with hx of venous disease:  Angela Irwin is a 50 y.o. female who presents with: chronic LLE swelling  Pt has palpable DP pulses bilaterally.  She does have hx of DVT and there is chronic thrombus stranding on u/s today at the [facility] and CFV.  It would be difficult to know how long this has been present.  She does not have any acute clot on u/s today.  -pt does have hx of Factor V Leiden and is not currently on [employer].  Will defer to her PCP for this.  She has seen hematologist in the past that recommended asa.  I offered referral to hematologist but she does not want to do this at this time.   -discussed with pt about wearing thigh high 20-36mmHg compression stockings as she does have reflux in the GSV.  Discussed with pt that she would need to f/u with MD in 3 months to determine if she is a candidate.  She was measured for these today.  -also discussed with her that if she travels, she should wear compression and take frequent breaks to ambulate.   -discussed the importance of leg elevation and how to elevate properly - pt is advised to elevate their legs and a diagram is given to them to demonstrate to lay flat on their back with knees elevated and slightly bent with their feet higher than her knees, which puts their feet higher than their heart for 15 minutes per day.  If they cannot lay flat, advised to lay as flat as possible.  -pt is advised to continue as much walking as possible and avoid sitting or standing for long periods of time.  -discussed importance of weight loss and exercise and that water aerobics would also be beneficial.  -handout with recommendations given -pt will call if she would like to be considered for laser ablation.    Nurse visit for full VS and weight check  - send info to provider   Discussed NOAC meds and monitoring - she will take if able to afford  She has 3 month vascular f/up  HLD - she did not take the meds and wanted to work on diet and weight loss - has a 3 month f/up to recheck   Return for Nurse visit for full VS and weight  check - send info to provider.   Danelle Berry, PA-C 05/09/21 2:37 PM

## 2021-05-09 NOTE — Patient Instructions (Signed)
With Factor V Leiden anticoagulation is recommended : we are more likely to advise indefinite anticoagulation in those whose Venous thromboembolism (VTE) is unprovoked, life-threatening, at an unusual site such as the mesenteric or portal vein, or with more than one episode of VTE.  You have had more than one episode and it is causing bothersome symptoms and pain.  I recommend starting an oral anticoagulant that is easy to take and does not require extensive monitoring - usually check labs 1-2x a year.    Recommend Xarelto or Eliquis - whichever is covered by insurance better or more affordable.  Both have started pack doses and then after 3 weeks the dose changes :  Xarelto - Oral: 15 mg twice daily with food for 21 days followed by 20 mg once daily with food.

## 2021-05-22 ENCOUNTER — Other Ambulatory Visit: Payer: Self-pay | Admitting: *Deleted

## 2021-05-22 ENCOUNTER — Inpatient Hospital Stay
Admission: RE | Admit: 2021-05-22 | Discharge: 2021-05-22 | Disposition: A | Discharge status: Home / Self Care | Payer: Self-pay | Source: Ambulatory Visit | Attending: *Deleted | Admitting: *Deleted

## 2021-05-22 DIAGNOSIS — Z1231 Encounter for screening mammogram for malignant neoplasm of breast: Secondary | ICD-10-CM

## 2021-05-30 ENCOUNTER — Ambulatory Visit: Payer: BC Managed Care – PPO | Admitting: Anesthesiology

## 2021-05-30 ENCOUNTER — Encounter: Payer: Self-pay | Admitting: Gastroenterology

## 2021-05-30 ENCOUNTER — Other Ambulatory Visit: Payer: Self-pay

## 2021-05-30 ENCOUNTER — Ambulatory Visit
Admission: RE | Admit: 2021-05-30 | Discharge: 2021-05-30 | Disposition: A | Discharge status: Home / Self Care | Payer: BC Managed Care – PPO | Source: Ambulatory Visit | Attending: Gastroenterology | Admitting: Gastroenterology

## 2021-05-30 ENCOUNTER — Encounter
Admission: RE | Disposition: A | Discharge status: Home / Self Care | Payer: Self-pay | Source: Ambulatory Visit | Attending: Gastroenterology

## 2021-05-30 DIAGNOSIS — Z803 Family history of malignant neoplasm of breast: Secondary | ICD-10-CM | POA: Diagnosis not present

## 2021-05-30 DIAGNOSIS — Z801 Family history of malignant neoplasm of trachea, bronchus and lung: Secondary | ICD-10-CM | POA: Diagnosis not present

## 2021-05-30 DIAGNOSIS — Z87891 Personal history of nicotine dependence: Secondary | ICD-10-CM | POA: Insufficient documentation

## 2021-05-30 DIAGNOSIS — K635 Polyp of colon: Secondary | ICD-10-CM | POA: Diagnosis not present

## 2021-05-30 DIAGNOSIS — Z1211 Encounter for screening for malignant neoplasm of colon: Secondary | ICD-10-CM | POA: Diagnosis not present

## 2021-05-30 HISTORY — PX: COLONOSCOPY WITH PROPOFOL: SHX5780

## 2021-05-30 LAB — POCT PREGNANCY, URINE: Preg Test, Ur: NEGATIVE

## 2021-05-30 SURGERY — COLONOSCOPY WITH PROPOFOL
Anesthesia: General

## 2021-05-30 MED ORDER — PROPOFOL 500 MG/50ML IV EMUL
INTRAVENOUS | Status: AC
  Filled 2021-05-30: qty 50

## 2021-05-30 MED ORDER — LIDOCAINE HCL (CARDIAC) PF 100 MG/5ML IV SOSY
PREFILLED_SYRINGE | INTRAVENOUS | Status: DC | PRN
  Administered 2021-05-30: 40 mg via INTRAVENOUS

## 2021-05-30 MED ORDER — PROPOFOL 500 MG/50ML IV EMUL
INTRAVENOUS | Status: DC | PRN
  Administered 2021-05-30: 150 ug/kg/min via INTRAVENOUS

## 2021-05-30 MED ORDER — SODIUM CHLORIDE 0.9 % IV SOLN: INTRAVENOUS | Status: DC

## 2021-05-30 MED ORDER — PROPOFOL 10 MG/ML IV BOLUS
INTRAVENOUS | Status: DC | PRN
  Administered 2021-05-30: 20 mg via INTRAVENOUS
  Administered 2021-05-30: 80 mg via INTRAVENOUS

## 2021-05-30 MED ORDER — LIDOCAINE HCL (PF) 2 % IJ SOLN
INTRAMUSCULAR | Status: AC
  Filled 2021-05-30: qty 2

## 2021-05-30 NOTE — Transfer of Care (Signed)
Immediate Anesthesia Transfer of Care Note  Patient: Angela Irwin  Procedure(s) Performed: COLONOSCOPY WITH PROPOFOL (N/A )  Patient Location: PACU  Anesthesia Type:General  Level of Consciousness: awake, drowsy and patient cooperative  Airway & Oxygen Therapy: Patient Spontanous Breathing  Post-op Assessment: Report given to RN and Post -op Vital signs reviewed and stable  Post vital signs: Reviewed and stable  Last Vitals:  Vitals Value Taken Time  BP 112/67 05/30/21 1126  Temp 36.2 C 05/30/21 1125  Pulse    Resp 17 05/30/21 1126  SpO2    Vitals shown include unvalidated device data.  Last Pain:  Vitals:   05/30/21 1125  TempSrc: Temporal  PainSc:          Complications: No complications documented.

## 2021-05-30 NOTE — Anesthesia Preprocedure Evaluation (Signed)
Anesthesia Evaluation  Patient identified by MRN, date of birth, ID band Patient awake    Reviewed: Allergy & Precautions, H&P , NPO status , Patient's Chart, lab work & pertinent test results, reviewed documented beta blocker date and time   Airway Mallampati: II   Neck ROM: full    Dental  (+) Teeth Intact   Pulmonary neg pulmonary ROS, former smoker,    Pulmonary exam normal        Cardiovascular Exercise Tolerance: Good negative cardio ROS Normal cardiovascular exam Rhythm:regular Rate:Normal     Neuro/Psych negative neurological ROS  negative psych ROS   GI/Hepatic negative GI ROS, Neg liver ROS,   Endo/Other  negative endocrine ROS  Renal/GU negative Renal ROS  negative genitourinary   Musculoskeletal   Abdominal   Peds  Hematology negative hematology ROS (+)   Anesthesia Other Findings Past Medical History: No date: Factor 5 Leiden mutation, heterozygous (HCC) No date: Glaucoma Past Surgical History: No date: DILATION AND CURETTAGE OF UTERUS   Reproductive/Obstetrics negative OB ROS                             Anesthesia Physical Anesthesia Plan  ASA: II  Anesthesia Plan: General   Post-op Pain Management:    Induction:   PONV Risk Score and Plan:   Airway Management Planned:   Additional Equipment:   Intra-op Plan:   Post-operative Plan:   Informed Consent: I have reviewed the patients History and Physical, chart, labs and discussed the procedure including the risks, benefits and alternatives for the proposed anesthesia with the patient or authorized representative who has indicated his/her understanding and acceptance.     Dental Advisory Given  Plan Discussed with: CRNA  Anesthesia Plan Comments:         Anesthesia Quick Evaluation

## 2021-05-30 NOTE — H&P (Signed)
Angela Bouillon, MD [address], [place], [place], [address] [address], [place], [place], [address] Phone: [phone]  Fax: [phone]  Primary Care Physician:  Danelle Berry, PA-C   Pre-Procedure History & Physical: HPI:  Angela Irwin is a 50 y.o. female is here for a colonoscopy.   Past Medical History:  Diagnosis Date  . Factor 5 Leiden mutation, heterozygous (HCC)   . Glaucoma     Past Surgical History:  Procedure Laterality Date  . DILATION AND CURETTAGE OF UTERUS      Prior to Admission medications   Medication Sig Start Date End Date Taking? Authorizing Provider  atorvastatin (LIPITOR) 10 MG tablet Take 1 tablet (10 mg total) by mouth daily. Patient not taking: No sig reported 04/15/21   Danelle Berry, PA-C  phentermine (ADIPEX-P) 37.5 MG tablet Take 1 tablet (37.5 mg total) by mouth daily before breakfast. 05/09/21   Danelle Berry, PA-C  phentermine (ADIPEX-P) 37.5 MG tablet Take 1 tablet (37.5 mg total) by mouth daily before breakfast. 06/08/21   Danelle Berry, PA-C  Rivaroxaban (XARELTO) 15 MG TABS tablet Take 1 tablet (15 mg total) by mouth 2 (two) times daily with a meal for 21 days. Patient not taking: Reported on 05/30/2021 05/09/21 05/30/21  Danelle Berry, PA-C  rivaroxaban (XARELTO) 20 MG TABS tablet Take 1 tablet (20 mg total) by mouth daily with supper. Patient not taking: Reported on 05/30/2021 05/31/21   Danelle Berry, PA-C    Allergies as of 04/30/2021 - Review Complete 04/07/2021  Allergen Reaction Noted  . Promethazine hcl      Family History  Problem Relation Age of Onset  . Heart attack Mother 29  . Hyperlipidemia Mother   . Hypertension Mother   . Kidney disease Mother   . Breast cancer Maternal Grandmother   . Lung cancer Maternal Grandfather   . Autism Brother     Social History   Socioeconomic History  . Marital status: Married    Spouse name: Angela Irwin  . Number of children: 2  . Years of education: 65  . Highest  education level: Some college, no degree  Occupational History  . Occupation: homemaker  Tobacco Use  . Smoking status: Former Games developer  . Smokeless tobacco: Never Used  Vaping Use  . Vaping Use: Never used  Substance and Sexual Activity  . Alcohol use: Never  . Drug use: Not Currently  . Sexual activity: Yes    Comment: tubal  Other Topics Concern  . Not on file  Social History Narrative  . Not on file   Social Determinants of Health   Financial Resource Strain: Low Risk   . Difficulty of Paying Living Expenses: Not hard at all  Food Insecurity: No Food Insecurity  . Worried About Programme researcher, broadcasting/film/video in the Last Year: Never true  . Ran Out of Food in the Last Year: Never true  Transportation Needs: No Transportation Needs  . Lack of Transportation (Medical): No  . Lack of Transportation (Non-Medical): No  Physical Activity: Insufficiently Active  . Days of Exercise per Week: 2 days  . Minutes of Exercise per Session: 40 min  Stress: No Stress Concern Present  . Feeling of Stress : Not at all  Social Connections: Moderately Integrated  . Frequency of Communication with Friends and Family: Three times a week  . Frequency of Social Gatherings with Friends and Family: Twice a week  . Attends Religious Services: More than 4 times per year  .  Active Member of Clubs or Organizations: No  . Attends Banker Meetings: Never  . Marital Status: Married  Catering manager Violence: Not At Risk  . Fear of Current or Ex-Partner: No  . Emotionally Abused: No  . Physically Abused: No  . Sexually Abused: No    Review of Systems: See HPI, otherwise negative ROS  Physical Exam: BP (!) 114/95   Pulse 93   Temp (!) 97.1 F (36.2 C) (Temporal)   Resp 18   Ht 5\' 3"  (1.6 m)   Wt 78.9 kg   LMP 04/30/2021   SpO2 100%   BMI 30.82 kg/m  General:   Alert,  pleasant and cooperative in NAD Head:  Normocephalic and atraumatic. Neck:  Supple; no masses or thyromegaly. Lungs:   Clear throughout to auscultation, normal respiratory effort.    Heart:  +S1, +S2, Regular rate and rhythm, No edema. Abdomen:  Soft, nontender and nondistended. Normal bowel sounds, without guarding, and without rebound.   Neurologic:  Alert and  oriented x4;  grossly normal neurologically.  Impression/Plan: Angela Irwin is here for a colonoscopy to be performed for average risk screening.  Risks, benefits, limitations, and alternatives regarding  colonoscopy have been reviewed with the patient.  Questions have been answered.  All parties agreeable.   Angela Ivan, MD  05/30/2021, 11:41 AM

## 2021-05-30 NOTE — Op Note (Signed)
[facility] Patient Name: Angela Irwin Procedure Date: 05/30/2021 10:38 AM MRN: [id number] Account #: [id number] Date of Birth: [birth year] Admit Type: Outpatient Age: 49 Room: [facility] ENDO ROOM 2 Gender: Female Note Status: Finalized Procedure:             Colonoscopy Indications:           Screening for colorectal malignant neoplasm Providers:             Maegen Wigle B. Maximino Greenland MD, MD Referring MD:          Danelle Berry (Referring MD) Medicines:             Monitored Anesthesia Care Complications:         No immediate complications. Procedure:             Pre-Anesthesia Assessment:                        - ASA Grade Assessment: II - A patient with mild                         systemic disease.                        - Prior to the procedure, a History and Physical was                         performed, and patient medications, allergies and                         sensitivities were reviewed. The patient's tolerance                         of previous anesthesia was reviewed.                        - The risks and benefits of the procedure and the                         sedation options and risks were discussed with the                         patient. All questions were answered and informed                         consent was obtained.                        - Patient identification and proposed procedure were                         verified prior to the procedure by the physician, the                         nurse, the anesthesiologist, the anesthetist and the                         technician. The procedure was verified in the                         procedure room.  After obtaining informed consent, the colonoscope was                         passed under direct vision. Throughout the procedure,                         the patient's blood pressure, pulse, and oxygen                         saturations were monitored  continuously. The                         Colonoscope was introduced through the anus and                         advanced to the the cecum, identified by appendiceal                         orifice and ileocecal valve. The colonoscopy was                         performed with ease. The patient tolerated the                         procedure well. The quality of the bowel preparation                         was good. Findings:      The perianal and digital rectal examinations were normal.      A 5 mm polyp was found in the sigmoid colon. The polyp was flat. The       polyp was removed with a cold snare. Resection and retrieval were       complete.      The exam was otherwise without abnormality.      The rectum, sigmoid colon, descending colon, transverse colon, ascending       colon and cecum appeared normal.      The retroflexed view of the distal rectum and anal verge was normal and       showed no anal or rectal abnormalities. Impression:            - One 5 mm polyp in the sigmoid colon, removed with a                         cold snare. Resected and retrieved.                        - The examination was otherwise normal.                        - The rectum, sigmoid colon, descending colon,                         transverse colon, ascending colon and cecum are normal.                        - The distal rectum and anal verge are normal on  retroflexion view. Recommendation:        - Await pathology results.                        - Discharge patient to home (with escort).                        - Advance diet as tolerated.                        - Continue present medications.                        - Repeat colonoscopy date to be determined after                         pending pathology results are reviewed.                        - The findings and recommendations were discussed with                         the patient.                        - The  findings and recommendations were discussed with                         the patient's family.                        - Return to primary care physician as previously                         scheduled. Procedure Code(s):     --- Professional ---                        [phone], Colonoscopy, flexible; with removal of                         tumor(s), polyp(s), or other lesion(s) by snare                         technique Diagnosis Code(s):     --- Professional ---                        Z12.11, Encounter for screening for malignant neoplasm                         of colon                        K63.5, Polyp of colon CPT copyright 2019 [employer]. All rights reserved. The codes documented in this report are preliminary and upon coder review may  be revised to meet current compliance requirements.  [name], MD Michel Bickers B. Maximino Greenland MD, MD 05/30/2021 11:42:57 AM This report has been signed electronically. Number of Addenda: 0 Note Initiated On: 05/30/2021 10:38 AM Scope Withdrawal Time: 0 hours 23 minutes 26 seconds  Total Procedure Duration: 0 hours 30 minutes 12 seconds  Estimated Blood Loss:  Estimated  blood loss: none.      [facility]

## 2021-06-01 NOTE — Anesthesia Postprocedure Evaluation (Signed)
Anesthesia Post Note  Patient: Angela Irwin  Procedure(s) Performed: COLONOSCOPY WITH PROPOFOL (N/A )  Patient location during evaluation: PACU Anesthesia Type: General Level of consciousness: awake and alert Pain management: pain level controlled Vital Signs Assessment: post-procedure vital signs reviewed and stable Respiratory status: spontaneous breathing, nonlabored ventilation, respiratory function stable and patient connected to nasal cannula oxygen Cardiovascular status: blood pressure returned to baseline and stable Postop Assessment: no apparent nausea or vomiting Anesthetic complications: no   No complications documented.   Last Vitals:  Vitals:   05/30/21 1135 05/30/21 1145  BP: (!) 114/95 112/75  Pulse:    Resp: 18   Temp:    SpO2: 100% 100%    Last Pain:  Vitals:   05/30/21 1145  TempSrc:   PainSc: 0-No pain                 Yevette Edwards

## 2021-06-02 ENCOUNTER — Encounter: Payer: Self-pay | Admitting: Gastroenterology

## 2021-06-02 LAB — SURGICAL PATHOLOGY

## 2021-06-04 ENCOUNTER — Encounter: Payer: Self-pay | Admitting: Gastroenterology

## 2021-06-05 ENCOUNTER — Ambulatory Visit
Admission: RE | Admit: 2021-06-05 | Discharge: 2021-06-05 | Disposition: A | Discharge status: Home / Self Care | Payer: BC Managed Care – PPO | Source: Ambulatory Visit | Attending: Family Medicine | Admitting: Family Medicine

## 2021-06-05 ENCOUNTER — Other Ambulatory Visit: Payer: Self-pay

## 2021-06-05 DIAGNOSIS — Z1231 Encounter for screening mammogram for malignant neoplasm of breast: Secondary | ICD-10-CM | POA: Diagnosis not present

## 2021-06-05 DIAGNOSIS — Z0000 Encounter for general adult medical examination without abnormal findings: Secondary | ICD-10-CM | POA: Diagnosis not present

## 2021-06-09 ENCOUNTER — Other Ambulatory Visit: Payer: Self-pay

## 2021-06-09 ENCOUNTER — Ambulatory Visit: Payer: BC Managed Care – PPO

## 2021-06-09 VITALS — BP 122/72 | HR 97 | Temp 98.4°F | Resp 16 | Ht 63.0 in | Wt 176.1 lb

## 2021-06-09 DIAGNOSIS — Z5181 Encounter for therapeutic drug level monitoring: Secondary | ICD-10-CM

## 2021-06-09 DIAGNOSIS — Z6833 Body mass index (BMI) 33.0-33.9, adult: Secondary | ICD-10-CM

## 2021-06-09 DIAGNOSIS — E669 Obesity, unspecified: Secondary | ICD-10-CM

## 2021-06-09 DIAGNOSIS — R635 Abnormal weight gain: Secondary | ICD-10-CM

## 2021-06-09 MED ORDER — PHENTERMINE HCL 37.5 MG PO TABS: 37.5000 mg | ORAL_TABLET | Freq: Every day | ORAL | 0 refills | Status: DC

## 2021-06-09 NOTE — Progress Notes (Signed)
Patient here for nurse visit for weight loss med and vital signs.  Bp: 122/72 P:97 T:98.4 o2:98 W: 176.1.  PT does request refills

## 2021-07-15 ENCOUNTER — Ambulatory Visit: Payer: BC Managed Care – PPO | Admitting: Family Medicine

## 2021-09-15 IMAGING — MG MM DIGITAL SCREENING BILAT W/ TOMO AND CAD
8 series · 8 of 24 positions shown · non-contrast
Comparison: Previous exam(s).

CLINICAL DATA: Screening.

EXAM:
DIGITAL SCREENING BILATERAL MAMMOGRAM WITH TOMOSYNTHESIS AND CAD
TECHNIQUE: Bilateral screening digital craniocaudal and mediolateral oblique
mammograms were obtained. Bilateral screening digital breast
tomosynthesis was performed. The images were evaluated with
computer-aided detection.

[L CC synth-2D]
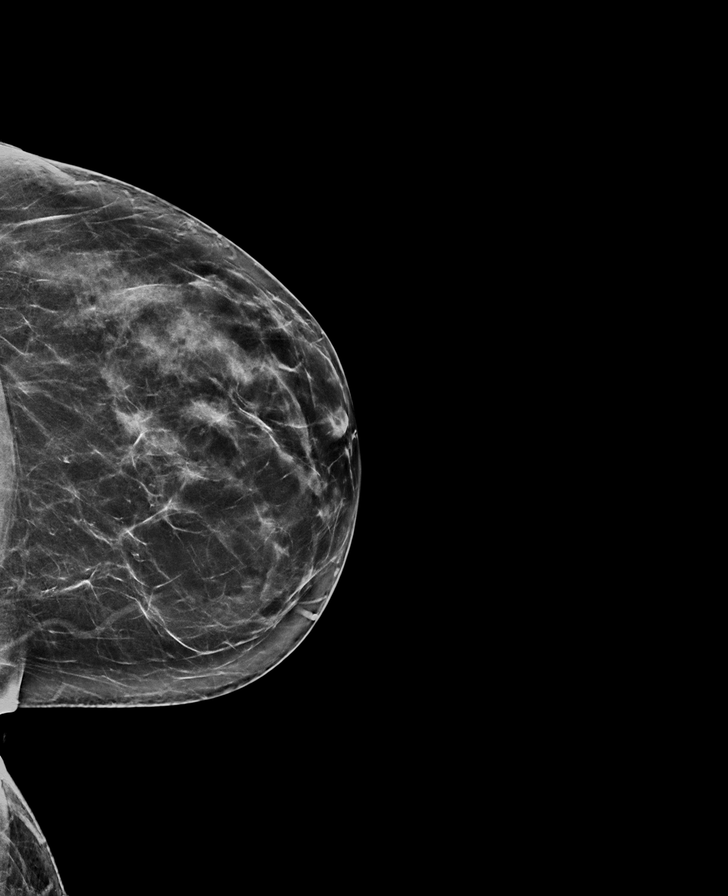

[R MLO synth-2D]
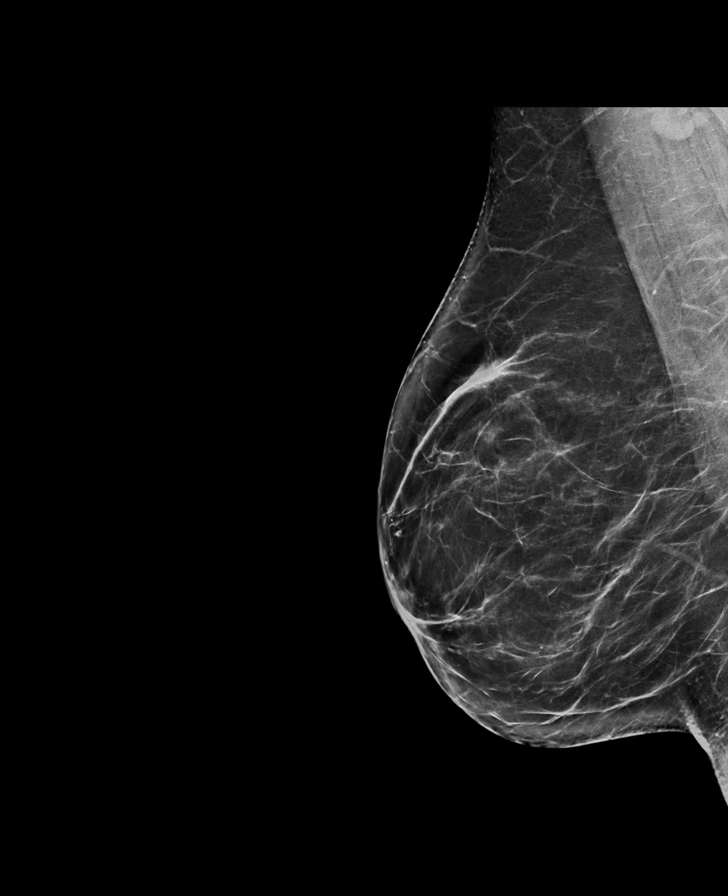

[L MLO synth-2D]
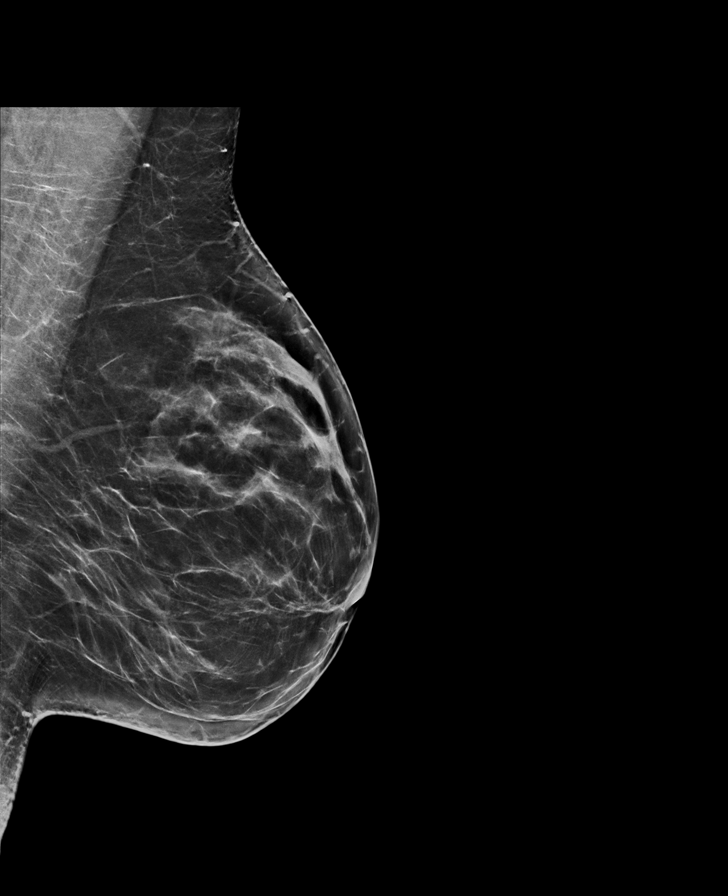

[R CC synth-2D]
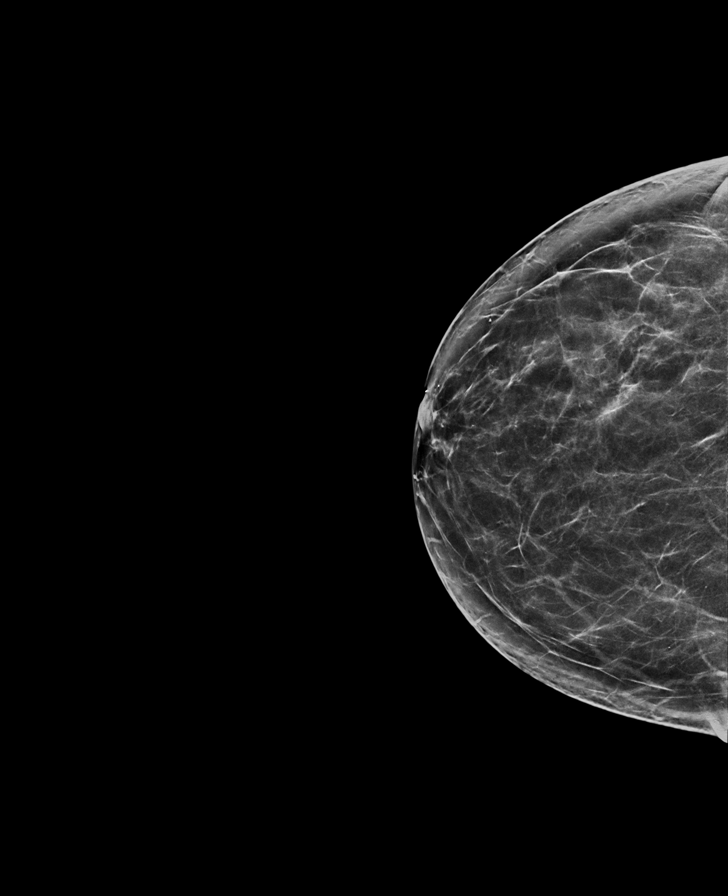

[L CC tomo · tomo slice 38/75.0]
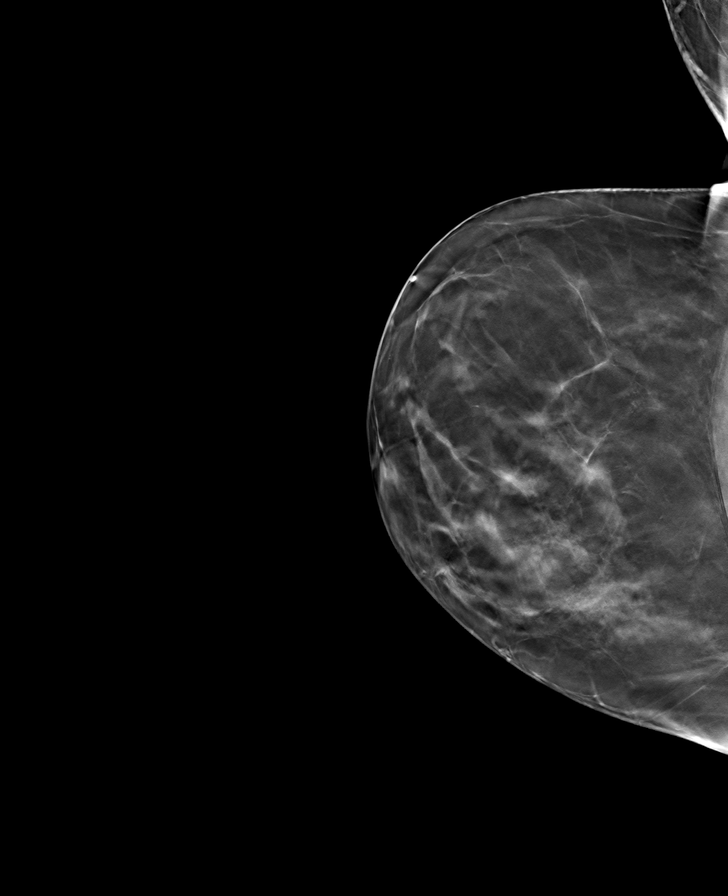

[L MLO tomo · tomo slice 39/77.0]
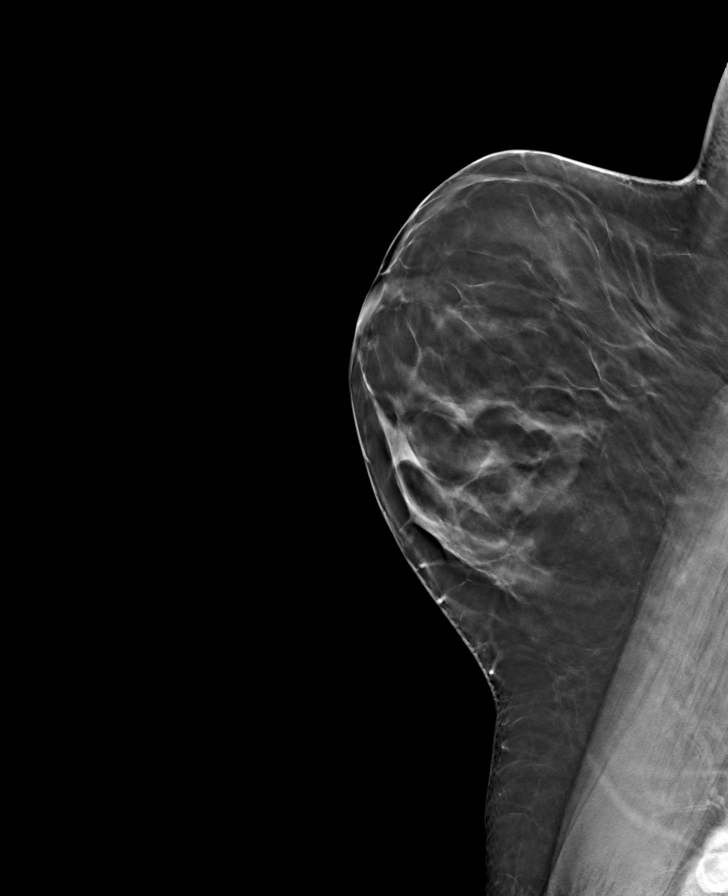

[R CC tomo · tomo slice 39/78.0]
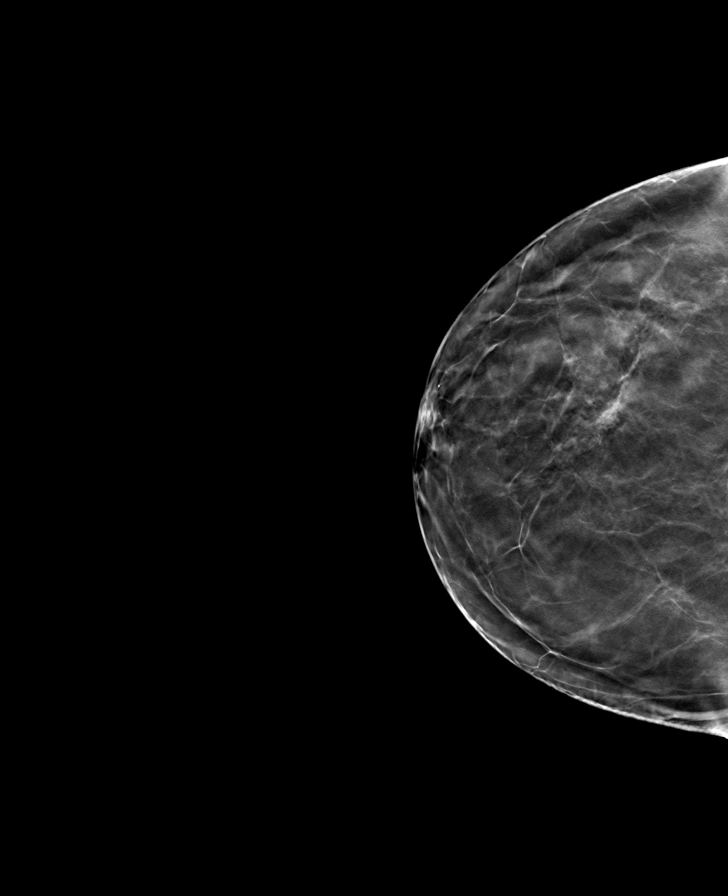

[R MLO tomo · tomo slice 41/81.0]
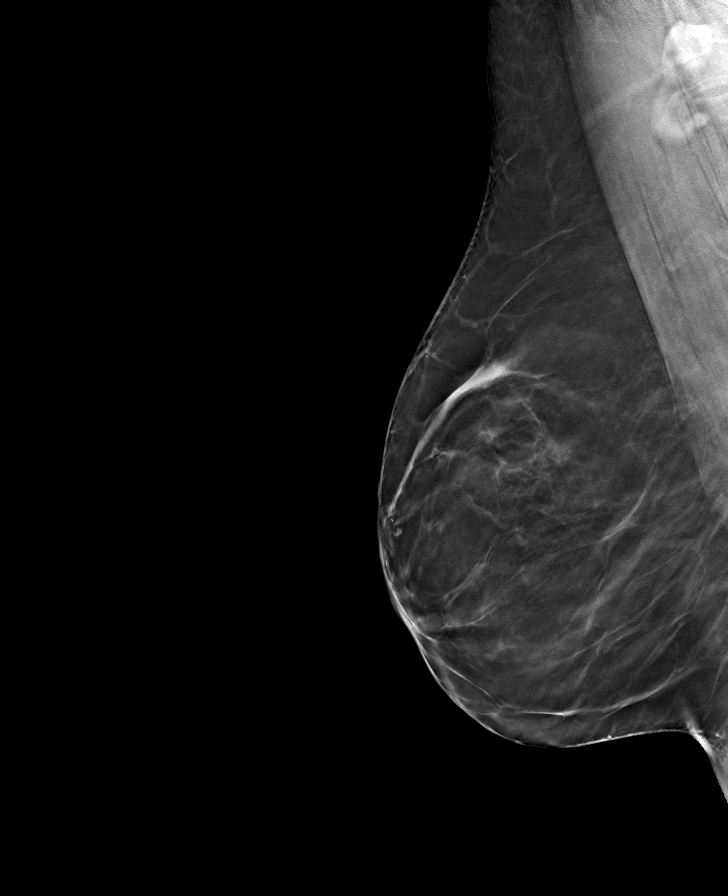

[8 of 24 positions shown; findings below may reference images not displayed]

ACR Breast Density Category b: There are scattered areas of
fibroglandular density.
FINDINGS: There are no findings suspicious for malignancy. The images were
evaluated with computer-aided detection.
IMPRESSION: No mammographic evidence of malignancy. A result letter of this
screening mammogram will be mailed directly to the patient.

RECOMMENDATION:
Screening mammogram in one year. (Code:WJ-[id number])

BI-RADS CATEGORY  1: Negative.

## 2022-01-16 ENCOUNTER — Telehealth (INDEPENDENT_AMBULATORY_CARE_PROVIDER_SITE_OTHER): Payer: BC Managed Care – PPO | Admitting: Nurse Practitioner

## 2022-01-16 DIAGNOSIS — J069 Acute upper respiratory infection, unspecified: Secondary | ICD-10-CM

## 2022-01-16 MED ORDER — BENZONATATE 100 MG PO CAPS: 200.0000 mg | ORAL_CAPSULE | Freq: Two times a day (BID) | ORAL | 0 refills | Status: DC | PRN

## 2022-01-16 MED ORDER — FLUTICASONE PROPIONATE 50 MCG/ACT NA SUSP: 2.0000 | Freq: Every day | NASAL | 6 refills | Status: DC

## 2022-01-16 NOTE — Progress Notes (Signed)
Name: Angela Irwin   MRN: [id number]    DOB: 04/29/1971   Date:01/16/2022       Progress Note  Subjective  Chief Complaint  Chief Complaint  Patient presents with   Nasal Congestion   Cough    Chest congestion   Generalized Body Aches    All sx started on 1/16, was around nephew who was sick and tested positive for flu.    I connected with  Angela Irwin  on 01/16/22 at 11:55 am by a video enabled telemedicine application and verified that I am speaking with the correct person using two identifiers.  I discussed the limitations of evaluation and management by telemedicine and the availability of in person appointments. The patient expressed understanding and agreed to proceed with a virtual visit  Staff also discussed with the patient that there may be a patient responsible charge related to this service. Patient Location: home Provider Location: [facility] Additional Individuals present: alone  HPI  URI: She says she has had symptoms for five days. She says her nephew came to visit her and he tested positive for the flu.  She says she did a home covid test and it was negative.  She is outside of the treatment time for flu and covid, so she declined testing.  She has been taking cold and flu medication.  She denies any fever or shortness of breath. She says main symptoms are cough and nasal congestion.  Discussed pushing fluids, and OTC treatments for symptoms. Will send in some tessalon perls and flonase.   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 (Patient not taking: Reported on 01/16/2022), Disp: 90 tablet, Rfl: 3   phentermine (ADIPEX-P) 37.5 MG tablet, Take 1 tablet (37.5 mg total) by mouth daily before breakfast. (Patient not taking: Reported on 01/16/2022), Disp: 30 tablet, Rfl: 0   phentermine (ADIPEX-P) 37.5 MG tablet, Take 1 tablet (37.5 mg total) by mouth daily before breakfast. (Patient not taking: Reported on 01/16/2022), Disp: 30 tablet, Rfl: 0   Rivaroxaban (XARELTO) 15 MG TABS tablet, Take 1 tablet (15 mg total) by mouth 2 (two) times daily with a meal for 21 days. (Patient not taking: Reported on 05/30/2021), Disp: 42 tablet, Rfl: 0   rivaroxaban (XARELTO) 20 MG TABS tablet, Take 1 tablet (20 mg total) by mouth daily with supper. (Patient not taking: Reported on 01/16/2022), Disp: 90 tablet, Rfl: 3  Allergies  Allergen Reactions  Promethazine Hcl     REACTION: gi upset    I personally reviewed active problem list, medication list, allergies with the patient/caregiver today.  ROS  Constitutional: Negative for fever or weight change.  HEENT: positive for nasal congestion Respiratory: positive for cough and negative for shortness of breath.   Cardiovascular: Negative for chest pain or palpitations.  Gastrointestinal: Negative for abdominal pain, no bowel changes.  Musculoskeletal: Negative for gait problem or joint swelling.  Skin: Negative for rash.  Neurological: Negative for dizziness or headache.  No other  specific complaints in a complete review of systems (except as listed in HPI above).   Objective  Virtual encounter, vitals not obtained.  There is no height or weight on file to calculate BMI.  Nursing Note and Vital Signs reviewed.  Physical Exam  Awake, alert and oriented, speaking in complete sentences  No results found for this or any previous visit (from the past 72 hour(s)).  Assessment & Plan  1. Viral upper respiratory tract infection -push fluids, get rest -OTC treatments for symptoms - fluticasone (FLONASE) 50 MCG/ACT nasal spray; Place 2 sprays into both nostrils daily.  Dispense: 16 g; Refill: 6 - benzonatate (TESSALON) 100 MG capsule; Take 2 capsules (200 mg total) by mouth 2 (two) times daily as needed for cough.  Dispense: 20 capsule; Refill: 0   -Red flags and when to present for emergency care or RTC including fever >101.22F, chest pain, shortness of breath, new/worsening/un-resolving symptoms,  reviewed with patient at time of visit. Follow up and care instructions discussed and provided in AVS. - I discussed the assessment and treatment plan with the patient. The patient was provided an opportunity to ask questions and all were answered. The patient agreed with the plan and demonstrated an understanding of the instructions.  I provided 15 minutes of non-face-to-face time during this encounter.  Angela Salines, FNP

## 2022-07-28 ENCOUNTER — Ambulatory Visit: Payer: BC Managed Care – PPO | Admitting: Family Medicine

## 2022-08-03 ENCOUNTER — Encounter: Payer: Self-pay | Admitting: Nurse Practitioner

## 2022-08-03 ENCOUNTER — Ambulatory Visit (INDEPENDENT_AMBULATORY_CARE_PROVIDER_SITE_OTHER): Payer: BC Managed Care – PPO | Admitting: Nurse Practitioner

## 2022-08-03 VITALS — BP 120/76 | HR 95 | Temp 98.0°F | Resp 16 | Ht 63.0 in | Wt 163.6 lb

## 2022-08-03 DIAGNOSIS — E669 Obesity, unspecified: Secondary | ICD-10-CM | POA: Diagnosis not present

## 2022-08-03 DIAGNOSIS — D6851 Activated protein C resistance: Secondary | ICD-10-CM

## 2022-08-03 DIAGNOSIS — Z0000 Encounter for general adult medical examination without abnormal findings: Secondary | ICD-10-CM

## 2022-08-03 DIAGNOSIS — Z6833 Body mass index (BMI) 33.0-33.9, adult: Secondary | ICD-10-CM

## 2022-08-03 DIAGNOSIS — Z1231 Encounter for screening mammogram for malignant neoplasm of breast: Secondary | ICD-10-CM | POA: Diagnosis not present

## 2022-08-03 DIAGNOSIS — I824Z2 Acute embolism and thrombosis of unspecified deep veins of left distal lower extremity: Secondary | ICD-10-CM

## 2022-08-03 DIAGNOSIS — E785 Hyperlipidemia, unspecified: Secondary | ICD-10-CM

## 2022-08-03 DIAGNOSIS — F419 Anxiety disorder, unspecified: Secondary | ICD-10-CM

## 2022-08-03 MED ORDER — BUSPIRONE HCL 5 MG PO TABS: 5.0000 mg | ORAL_TABLET | Freq: Three times a day (TID) | ORAL | 1 refills | Status: DC | PRN

## 2022-08-03 NOTE — Progress Notes (Signed)
Name: Angela Irwin   MRN: [id number]    DOB: 21-Jan-1971   Date:08/03/2022       Progress Note  Subjective  Chief Complaint  Chief Complaint  Patient presents with   Annual Exam    Pt mentioned she was given cholesterol medication but never started it.    HPI  Patient presents for annual CPE and chronic condition  Chronic DVT/Factor V Leiden: Patient reports that she was diagnosed with a chronic DVT in her left leg last year.  Patient states she was put on a blood thinner but could not afford it.  Patient states she also saw vascular surgery and they said that they recommended that she get a ablation but she would have to see another specialist.  Patient states she has not been taking any blood thinners in a year.  Discussed recommendations of seeing hematology.  Patient is in agreement with plan will refer to hematology.  Diet: well balanced diet Exercise: at least 3 times a week she goes to the gym, thirty minutes cardio and then weight machines  Sleep:5 hours Stress/anxiety:: Her stress has increased recently.  Discussed medications that could help.  Patient would like to try BuSpar 5 mg 3 times a day as needed.  His PHQ-9 is negative but her GAD score is positive. [place] [facility] Visit from 08/03/2022 in [facility]  AUDIT-C Score 1      Depression: Phq 9 is  negative    08/03/2022    3:34 PM 01/16/2022   11:12 AM 05/09/2021    2:22 PM 04/07/2021    8:21 AM 12/04/2019    3:28 PM  Depression screen PHQ 2/9  Decreased Interest 0 0 0 0 0  Down, Depressed, Hopeless 0 0 0 0 0  PHQ - 2 Score 0 0 0 0 0  Altered sleeping 0 0 0 0 2  Tired, decreased energy 0 0 0 0 0  Change in appetite 0 0 0 0 0  Feeling bad or failure about yourself  0 0 0 0 0  Trouble concentrating 0 0 0 0 0  Moving slowly or fidgety/restless 0 0 0 0 0  Suicidal thoughts 0 0 0 0 0  PHQ-9 Score 0 0 0 0 2  Difficult doing work/chores Not difficult at all Not difficult at all Not difficult at  all Not difficult at all Not difficult at all      08/03/2022    3:56 PM  GAD 7 : Generalized Anxiety Score  Nervous, Anxious, on Edge 1  Control/stop worrying 1  Worry too much - different things 1  Trouble relaxing 1  Restless 1  Easily annoyed or irritable 1  Afraid - awful might happen 1  Total GAD 7 Score 7  Anxiety Difficulty Not difficult at all     Hypertension: BP Readings from Last 3 Encounters:  08/03/22 120/76  06/09/21 122/72  05/30/21 112/75   Obesity: Wt Readings from Last 3 Encounters:  08/03/22 163 lb 9.6 oz (74.2 kg)  06/09/21 176 lb 1.6 oz (79.9 kg)  05/30/21 174 lb (78.9 kg)   BMI Readings from Last 3 Encounters:  08/03/22 28.98 kg/m  06/09/21 31.19 kg/m  05/30/21 30.82 kg/m     Vaccines:  HPV: up to at age 19 , ask insurance if age between 15-45  Shingrix: 27-64 yo and ask insurance if covered when patient above 59 yo Pneumonia:  educated and discussed with patient. Flu:  educated and discussed with  patient.  Hep C Screening: 04/07/2021 STD testing and prevention (HIV/chl/gon/syphilis): 04/07/2021 Intimate partner violence:none Sexual History :yes, one partner Menstrual History/LMP/Abnormal Bleeding: [facility]: 07/12/2022, regular Incontinence Symptoms: none  Breast cancer:  - Last Mammogram: 06/05/2021, due order placed - BRCA gene screening: none  Osteoporosis: Discussed high calcium and vitamin D supplementation, weight bearing exercises  Cervical cancer screening: 12/04/2019  Skin cancer: Discussed monitoring for atypical lesions  Colorectal cancer: 05/30/2021   Lung cancer:   Low Dose CT Chest recommended if Age 68-80 years, 20 pack-year currently smoking OR have quit w/in 15years. Patient does not qualify.   ECG: none  Advanced Care Planning: A voluntary discussion about advance care planning including the explanation and discussion of advance directives.  Discussed health care proxy and Living will, and the patient was able to identify a  health care proxy as husband.  Patient does not have a living will at present time. If patient does have living will, I have requested they bring this to the clinic to be scanned in to their chart.  Lipids: Lab Results  Component Value Date   CHOL 252 (H) 04/07/2021   CHOL 269 (H) 12/04/2019   Lab Results  Component Value Date   HDL 57 04/07/2021   HDL 52 12/04/2019   Lab Results  Component Value Date   LDLCALC 175 (H) 04/07/2021   LDLCALC 192 (H) 12/04/2019   Lab Results  Component Value Date   TRIG 86 04/07/2021   TRIG 122 12/04/2019   Lab Results  Component Value Date   CHOLHDL 4.4 04/07/2021   CHOLHDL 5.2 (H) 12/04/2019   No results found for: "LDLDIRECT"  Glucose: Glucose, Bld  Date Value Ref Range Status  04/07/2021 94 65 - 99 mg/dL Final    Comment:    .            Fasting reference interval .   12/04/2019 94 65 - 99 mg/dL Final    Comment:    .            Fasting reference interval .     Patient Active Problem List   Diagnosis Date Noted   Encounter for screening colonoscopy    Polyp of colon    Deep vein thrombosis (DVT) of distal vein of left lower extremity ([place]) 05/09/2021   Recurrent acute deep vein thrombosis (DVT) of left lower extremity ([place]) 05/09/2021   Coagulopathy ([place]) 05/09/2021   Hyperlipidemia 05/09/2021   Class 1 obesity with body mass index (BMI) of 33.0 to 33.9 in adult 05/09/2021   Unintended weight gain 05/09/2021   Factor V Leiden ([place]) 11/10/2019   Glaucoma of both eyes 11/10/2019   Insomnia 11/10/2019   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 11/10/2019   History of fracture of left ankle 11/10/2019   MASTALGIA 05/27/2010   BACK PAIN, LUMBAR, CHRONIC 05/27/2010    Past Surgical History:  Procedure Laterality Date   COLONOSCOPY WITH PROPOFOL N/A 05/30/2021   Procedure: COLONOSCOPY WITH PROPOFOL;  Surgeon: Virgel Manifold, MD;  Location: [facility] ENDOSCOPY;  Service: Endoscopy;  Laterality: N/A;    DILATION AND CURETTAGE OF UTERUS      Family History  Problem Relation Age of Onset   Heart attack Mother 41   Hyperlipidemia Mother    Hypertension Mother    Kidney disease Mother    Breast cancer Maternal Grandmother    Lung cancer Maternal Grandfather    Autism Brother     Social History  Socioeconomic History   Marital status: Married    Spouse name: Feliz Beam   Number of children: 2   Years of education: 12   Highest education level: Some college, no degree  Occupational History   Occupation: homemaker  Tobacco Use   Smoking status: Former   Smokeless tobacco: Never  Building services engineer Use: Some days  Substance and Sexual Activity   Alcohol use: Yes    Comment: socially   Drug use: Not Currently   Sexual activity: Yes    Comment: tubal  Other Topics Concern   Not on file  Social History Narrative   Not on file   Social Determinants of Health   Financial Resource Strain: Low Risk  (08/03/2022)   Overall Financial Resource Strain (CARDIA)    Difficulty of Paying Living Expenses: Not hard at all  Food Insecurity: No Food Insecurity (08/03/2022)   Hunger Vital Sign    Worried About Running Out of Food in the Last Year: Never true    Ran Out of Food in the Last Year: Never true  Transportation Needs: No Transportation Needs (08/03/2022)   PRAPARE - Administrator, Civil Service (Medical): No    Lack of Transportation (Non-Medical): No  Physical Activity: Sufficiently Active (08/03/2022)   Exercise Vital Sign    Days of Exercise per Week: 3 days    Minutes of Exercise per Session: 90 min  Stress: Stress Concern Present (08/03/2022)   [employer] of Occupational Health - Occupational Stress Questionnaire    Feeling of Stress : To some extent  Social Connections: Socially Integrated (08/03/2022)   Social Connection and Isolation Panel [NHANES]    Frequency of Communication with Friends and Family: More than three times a week    Frequency of Social  Gatherings with Friends and Family: More than three times a week    Attends Religious Services: More than 4 times per year    Active Member of [employer] or Organizations: Yes    Attends Banker Meetings: More than 4 times per year    Marital Status: Married  Catering manager Violence: Not At Risk (08/03/2022)   Humiliation, Afraid, Rape, and Kick questionnaire    Fear of Current or Ex-Partner: No    Emotionally Abused: No    Physically Abused: No    Sexually Abused: No    No current outpatient medications on file.  Allergies  Allergen Reactions   Promethazine Hcl     REACTION: gi upset     ROS  Constitutional: Negative for fever or weight change.  Respiratory: Negative for cough and shortness of breath.   Cardiovascular: Negative for chest pain or palpitations.  Gastrointestinal: Negative for abdominal pain, no bowel changes.  Musculoskeletal: Negative for gait problem or joint swelling.  Skin: Negative for rash.  Neurological: Negative for dizziness or headache.  No other specific complaints in a complete review of systems (except as listed in HPI above).   Objective  Vitals:   08/03/22 1537  BP: 120/76  Pulse: 95  Resp: 16  Temp: 98 F (36.7 C)  TempSrc: Oral  SpO2: 100%  Weight: 163 lb 9.6 oz (74.2 kg)  Height: 5\' 3"  (1.6 m)    Body mass index is 28.98 kg/m.  Physical Exam  Constitutional: Patient appears well-developed and well-nourished. No distress.  HENT: Head: Normocephalic and atraumatic. Ears: B TMs ok, no erythema or effusion; Nose: Nose normal. Mouth/Throat: Oropharynx is clear and moist. No oropharyngeal exudate.  Eyes: Conjunctivae and EOM are normal. Pupils are equal, round, and reactive to light. No scleral icterus.  Neck: Normal range of motion. Neck supple. No JVD present. No thyromegaly present.  Cardiovascular: Normal rate, regular rhythm and normal heart sounds.  No murmur heard. No BLE edema. Pulmonary/Chest: Effort normal and  breath sounds normal. No respiratory distress. Abdominal: Soft. Bowel sounds are normal, no distension. There is no tenderness. no masses Breast: no lumps or masses, no nipple discharge or rashes Musculoskeletal: Normal range of motion, no joint effusions. No gross deformities LLE: swelling present Neurological: he is alert and oriented to person, place, and time. No cranial nerve deficit. Coordination, balance, strength, speech and gait are normal.  Skin: Skin is warm and dry. No rash noted. No erythema.  Psychiatric: Patient has a normal mood and affect. behavior is normal. Judgment and thought content normal.   No results found for this or any previous visit (from the past 2160 hour(s)).    Fall Risk:    08/03/2022    3:33 PM 01/16/2022   11:12 AM 05/09/2021    2:22 PM 04/07/2021    8:21 AM 12/04/2019    3:28 PM  Fall Risk   Falls in the past year? 0 0 0 0 0  Number falls in past yr: 0 0 0 0 0  Injury with Fall? 0 0 0 0 0  Risk for fall due to : No Fall Risks No Fall Risks     Follow up Falls prevention discussed;Education provided Falls prevention discussed        Functional Status Survey: Is the patient deaf or have difficulty hearing?: No Does the patient have difficulty seeing, even when wearing glasses/contacts?: No Does the patient have difficulty concentrating, remembering, or making decisions?: No Does the patient have difficulty walking or climbing stairs?: No Does the patient have difficulty dressing or bathing?: No Does the patient have difficulty doing errands alone such as visiting a doctor's office or shopping?: No   Assessment & Plan  1. Annual physical exam  - Lipid panel - CBC with Differential/Platelet - COMPLETE METABOLIC PANEL WITH GFR - Hemoglobin A1c - MM 3D SCREEN BREAST BILATERAL; Future  2. Class 1 obesity with body mass index (BMI) of 33.0 to 33.9 in adult, unspecified obesity type, unspecified whether serious comorbidity present  - Lipid  panel - CBC with Differential/Platelet - COMPLETE METABOLIC PANEL WITH GFR - Hemoglobin A1c  3. Hyperlipidemia, unspecified hyperlipidemia type  - Lipid panel  4. Encounter for screening mammogram for malignant neoplasm of breast  - MM 3D SCREEN BREAST BILATERAL; Future  5. Anxiety  - busPIRone (BUSPAR) 5 MG tablet; Take 1 tablet (5 mg total) by mouth 3 (three) times daily as needed.  Dispense: 90 tablet; Refill: 1  6. Factor V Leiden [facility])  - Ambulatory referral to Hematology / Oncology  7. Deep vein thrombosis (DVT) of distal vein of left lower extremity, unspecified chronicity ([place])  - Ambulatory referral to Hematology / Oncology   -USPSTF grade A and B recommendations reviewed with patient; age-appropriate recommendations, preventive care, screening tests, etc discussed and encouraged; healthy living encouraged; see AVS for patient education given to patient -Discussed importance of 150 minutes of physical activity weekly, eat two servings of fish weekly, eat one serving of tree nuts ( cashews, pistachios, pecans, almonds.Marland Kitchen) every other day, eat 6 servings of fruit/vegetables daily and drink plenty of water and avoid sweet beverages.

## 2022-08-04 LAB — COMPLETE METABOLIC PANEL WITH GFR
AG Ratio: 1.7 (calc) (ref 1.0–2.5)
ALT: 9 U/L (ref 6–29)
AST: 12 U/L (ref 10–35)
Albumin: 4.3 g/dL (ref 3.6–5.1)
Alkaline phosphatase (APISO): 42 U/L (ref 31–125)
BUN: 14 mg/dL (ref 7–25)
CO2: 26 mmol/L (ref 20–32)
Calcium: 9.6 mg/dL (ref 8.6–10.2)
Chloride: 102 mmol/L (ref 98–110)
Creat: 0.82 mg/dL (ref 0.50–0.99)
Globulin: 2.6 g/dL (calc) (ref 1.9–3.7)
Glucose, Bld: 92 mg/dL (ref 65–99)
Potassium: 4 mmol/L (ref 3.5–5.3)
Sodium: 137 mmol/L (ref 135–146)
Total Bilirubin: 0.5 mg/dL (ref 0.2–1.2)
Total Protein: 6.9 g/dL (ref 6.1–8.1)
eGFR: 88 mL/min/{1.73_m2} (ref >=60)

## 2022-08-04 LAB — CBC WITH DIFFERENTIAL/PLATELET
Absolute Monocytes: 444 cells/uL (ref 200–950)
Basophils Absolute: 81 cells/uL (ref 0–200)
Basophils Relative: 1.1 %
Eosinophils Absolute: 259 cells/uL (ref 15–500)
Eosinophils Relative: 3.5 %
HCT: 38.1 % (ref 35.0–45.0)
Hemoglobin: 12.9 g/dL (ref 11.7–15.5)
Lymphs Abs: 1702 cells/uL (ref 850–3900)
MCH: 30.2 pg (ref 27.0–33.0)
MCHC: 33.9 g/dL (ref 32.0–36.0)
MCV: 89.2 fL (ref 80.0–100.0)
MPV: 10.6 fL (ref 7.5–12.5)
Monocytes Relative: 6 %
Neutro Abs: 4914 cells/uL (ref 1500–7800)
Neutrophils Relative %: 66.4 %
Platelets: 309 10*3/uL (ref 140–400)
RBC: 4.27 10*6/uL (ref 3.80–5.10)
RDW: 13.1 % (ref 11.0–15.0)
Total Lymphocyte: 23 %
WBC: 7.4 10*3/uL (ref 3.8–10.8)

## 2022-08-04 LAB — LIPID PANEL
Cholesterol: 239 mg/dL — ABNORMAL HIGH (ref <200)
HDL: 57 mg/dL (ref >=50)
LDL Cholesterol (Calc): 155 mg/dL (calc) — ABNORMAL HIGH
Non-HDL Cholesterol (Calc): 182 mg/dL (calc) — ABNORMAL HIGH (ref <130)
Total CHOL/HDL Ratio: 4.2 (calc) (ref <5.0)
Triglycerides: 147 mg/dL (ref <150)

## 2022-08-04 LAB — HEMOGLOBIN A1C
Hgb A1c MFr Bld: 5.2 % of total Hgb (ref <5.7)
Mean Plasma Glucose: 103 mg/dL
eAG (mmol/L): 5.7 mmol/L

## 2022-08-31 NOTE — Progress Notes (Signed)
[facility]  [phone] [phone]  ID: Angela Irwin OB: [birth year]  MR#: [id number]  ZWC#:[id number]  Patient Care Team: [name], FNP as PCP - General (Nurse Practitioner)  CHIEF COMPLAINT: Factor V Leiden.  INTERVAL HISTORY: Patient is a 51 year old female with a distant history of DVT and possible diagnosis of factor V Leiden.  Patient states when she was 51 years old after fracturing her ankle and while she was pregnant she developed extensive DVT in her left leg and was found to have factor V Leiden.  She was placed on Coumadin at that time.  Approximately 7 years later when patient was off anticoagulation she was pregnant again and again developed DVT in her left leg.  She was subsequently placed on Lovenox for the duration of her pregnancy.  Since that time over the past 20+ years she has not had any blood clot or DVT.  She reports recent imaging noting chronic clot in her left leg and is referred for further evaluation.  She currently feels well and is asymptomatic.  She has no neurologic complaints.  She denies any recent fevers or illnesses.  She has a good appetite and denies weight loss.  She has no chest pain, shortness of breath, cough, or hemoptysis.  She denies any nausea, vomiting, constipation, or diarrhea.  She has no urinary complaints.  Patient feels at her baseline offers no specific complaints today.  REVIEW OF SYSTEMS:   Review of Systems  Constitutional:  Negative for fever, malaise/fatigue and weight loss.  Respiratory: Negative.  Negative for cough, hemoptysis and shortness of breath.   Cardiovascular: Negative.  Negative for chest pain and leg swelling.  Gastrointestinal: Negative.  Negative for abdominal pain.  Genitourinary: Negative.  Negative for dysuria.  Musculoskeletal: Negative.  Negative for back pain.  Skin: Negative.  Negative for rash.  Neurological: Negative.  Negative for dizziness, focal weakness,  weakness and headaches.  Psychiatric/Behavioral: Negative.  The patient is not nervous/anxious.     As per HPI. Otherwise, a complete review of systems is negative.  PAST MEDICAL HISTORY: Past Medical History:  Diagnosis Date   Arthritis    In ankle that was broken   Factor 5 Leiden mutation, heterozygous (H[facility])    Glaucoma     PAST SURGICAL HISTORY: Past Surgical History:  Procedure Laterality Date   COLONOSCOPY WITH PROPOFOL N/A 05/30/2021   Procedure: COLONOSCOPY WITH PROPOFOL;  Surgeon: Pasty Spillers, MD;  Location: [facility] ENDOSCOPY;  Service: Endoscopy;  Laterality: N/A;   DILATION AND CURETTAGE OF UTERUS      FAMILY HISTORY: Family History  Problem Relation Age of Onset   Heart attack Mother 26   Hyperlipidemia Mother    Hypertension Mother    Kidney disease Mother    Breast cancer Maternal Grandmother    Cancer Maternal Grandmother    Lung cancer Maternal Grandfather    Autism Brother     ADVANCED DIRECTIVES (Y/N):  N  HEALTH MAINTENANCE: Social History   Tobacco Use   Smoking status: Former   Smokeless tobacco: Never  Building services engineer Use: Some days  Substance Use Topics   Alcohol use: Yes    Alcohol/week: 1.0 standard drink of alcohol    Types: 1 Glasses of wine per week    Comment: socially   Drug use: Not Currently     Colonoscopy:  PAP:  Bone density:  Lipid panel:  Allergies  Allergen Reactions   Promethazine Hcl  REACTION: gi upset    Current Outpatient Medications  Medication Sig Dispense Refill   busPIRone (BUSPAR) 5 MG tablet Take 1 tablet (5 mg total) by mouth 3 (three) times daily as needed. 90 tablet 1   No current facility-administered medications for this visit.    OBJECTIVE: Vitals:   09/01/22 1340  BP: 126/78  Pulse: 89  Resp: 16  Temp: (!) 96.9 F (36.1 C)  SpO2: 100%     Body mass index is 29.05 kg/m.    ECOG FS:0 - Asymptomatic  General: Well-developed, well-nourished, no acute distress. Eyes: Pink  conjunctiva, anicteric sclera. HEENT: Normocephalic, moist mucous membranes. Lungs: No audible wheezing or coughing. Heart: Regular rate and rhythm. Abdomen: Soft, nontender, no obvious distention. Musculoskeletal: No edema, cyanosis, or clubbing. Neuro: Alert, answering all questions appropriately. Cranial nerves grossly intact. Skin: No rashes or petechiae noted. Psych: Normal affect. Lymphatics: No cervical, calvicular, axillary or inguinal LAD.   LAB RESULTS:  Lab Results  Component Value Date   NA 137 08/03/2022   K 4.0 08/03/2022   CL 102 08/03/2022   CO2 26 08/03/2022   GLUCOSE 92 08/03/2022   BUN 14 08/03/2022   CREATININE 0.82 08/03/2022   CALCIUM 9.6 08/03/2022   PROT 6.9 08/03/2022   AST 12 08/03/2022   ALT 9 08/03/2022   BILITOT 0.5 08/03/2022   GFRNONAA 91 04/07/2021   GFRAA 106 04/07/2021    Lab Results  Component Value Date   WBC 7.4 08/03/2022   NEUTROABS 4,914 08/03/2022   HGB 12.9 08/03/2022   HCT 38.1 08/03/2022   MCV 89.2 08/03/2022   PLT 309 08/03/2022     STUDIES: No results found.  ASSESSMENT: Factor V Leiden.  PLAN:    Factor V Leiden: Patient states when she was 51 years old after fracturing her ankle and while she was pregnant she developed extensive DVT in her left leg and was found to have factor V Leiden.  She was placed on Coumadin at that time.  Approximately 7 years later when patient was off anticoagulation she was pregnant again and again developed DVT in her left leg.  She was subsequently placed on Lovenox for the duration of her pregnancy.  Since that time, over the past 20+ years, she has not had any blood clot or DVT.  She has not been on anticoagulation over that time span either.  Recent left leg ultrasound and a physician's office reported chronic clot in her left leg, but we do not have these results.  She also reports her daughter was recently diagnosed with factor V Leiden.  We will repeat laboratory work today for  documentation purposes.  We will also get ultrasound of bilateral lower extremities for further evaluation.  Patient does not require anticoagulation at this time.  Patient will have video-assisted telemedicine visit in approximately 3 weeks to discuss the results.   I spent a total of 45 minutes reviewing chart data, face-to-face evaluation with the patient, counseling and coordination of care as detailed above.   Patient expressed understanding and was in agreement with this plan. She also understands that She can call clinic at any time with any questions, concerns, or complaints.    Jeralyn Ruths, MD   09/03/2022 9:59 AM

## 2022-09-01 ENCOUNTER — Inpatient Hospital Stay: Payer: BC Managed Care – PPO

## 2022-09-01 ENCOUNTER — Inpatient Hospital Stay: Payer: BC Managed Care – PPO | Attending: Oncology | Admitting: Oncology

## 2022-09-01 ENCOUNTER — Encounter: Payer: Self-pay | Admitting: Oncology

## 2022-09-01 VITALS — BP 126/78 | HR 89 | Temp 96.9°F | Resp 16 | Ht 63.0 in | Wt 164.0 lb

## 2022-09-01 DIAGNOSIS — Z801 Family history of malignant neoplasm of trachea, bronchus and lung: Secondary | ICD-10-CM | POA: Diagnosis not present

## 2022-09-01 DIAGNOSIS — D6851 Activated protein C resistance: Secondary | ICD-10-CM | POA: Diagnosis not present

## 2022-09-01 DIAGNOSIS — Z803 Family history of malignant neoplasm of breast: Secondary | ICD-10-CM | POA: Diagnosis not present

## 2022-09-01 DIAGNOSIS — Z87891 Personal history of nicotine dependence: Secondary | ICD-10-CM | POA: Diagnosis not present

## 2022-09-08 LAB — FACTOR 5 LEIDEN

## 2022-09-14 ENCOUNTER — Telehealth: Payer: Self-pay

## 2022-09-14 NOTE — Telephone Encounter (Signed)
Patient wants to cancel venous US at this time. She states she is wanting to pay off medical bills first. Just FYI

## 2022-09-15 ENCOUNTER — Ambulatory Visit: Payer: BC Managed Care – PPO

## 2022-09-22 ENCOUNTER — Telehealth: Payer: BC Managed Care – PPO | Admitting: Oncology

## 2022-10-20 ENCOUNTER — Ambulatory Visit (INDEPENDENT_AMBULATORY_CARE_PROVIDER_SITE_OTHER): Payer: BC Managed Care – PPO | Admitting: Nurse Practitioner

## 2022-10-20 ENCOUNTER — Other Ambulatory Visit (HOSPITAL_COMMUNITY)
Admission: RE | Admit: 2022-10-20 | Discharge: 2022-10-20 | Disposition: A | Discharge status: Home / Self Care | Payer: BC Managed Care – PPO | Source: Ambulatory Visit | Attending: Nurse Practitioner | Admitting: Nurse Practitioner

## 2022-10-20 ENCOUNTER — Encounter: Payer: Self-pay | Admitting: Nurse Practitioner

## 2022-10-20 VITALS — BP 122/70 | HR 81 | Temp 97.8°F | Resp 16 | Ht 63.0 in | Wt 167.6 lb

## 2022-10-20 DIAGNOSIS — R5383 Other fatigue: Secondary | ICD-10-CM | POA: Diagnosis not present

## 2022-10-20 DIAGNOSIS — F419 Anxiety disorder, unspecified: Secondary | ICD-10-CM

## 2022-10-20 DIAGNOSIS — N898 Other specified noninflammatory disorders of vagina: Secondary | ICD-10-CM | POA: Insufficient documentation

## 2022-10-20 DIAGNOSIS — E663 Overweight: Secondary | ICD-10-CM

## 2022-10-20 DIAGNOSIS — J069 Acute upper respiratory infection, unspecified: Secondary | ICD-10-CM | POA: Diagnosis not present

## 2022-10-20 MED ORDER — FLUCONAZOLE 150 MG PO TABS: 150.0000 mg | ORAL_TABLET | ORAL | 0 refills | Status: DC | PRN

## 2022-10-20 MED ORDER — PHENTERMINE HCL 37.5 MG PO TABS: 37.5000 mg | ORAL_TABLET | Freq: Every day | ORAL | 2 refills | Status: DC

## 2022-10-20 MED ORDER — BUSPIRONE HCL 5 MG PO TABS: 5.0000 mg | ORAL_TABLET | Freq: Three times a day (TID) | ORAL | 1 refills | Status: AC | PRN

## 2022-10-20 NOTE — Progress Notes (Signed)
BP 122/70   Pulse 81   Temp 97.8 F (36.6 C)   Resp 16   Ht 5\' 3"  (1.6 m)   Wt 167 lb 9.6 oz (76 kg)   SpO2 98%   BMI 29.69 kg/m    Subjective:    Patient ID: , female    DOB: 27-Jun-1971, 51 y.o.   MRN: [id number]  HPI: Angela Irwin is a 51 y.o. female  Chief Complaint  Patient presents with   URI    Cough, congested, tired for 8 days   Vaginitis   Obesity    Start phentermine again   URI: She says that symptoms started over a week ago.  She says she has a cough, fatigue, sinus congestion, and some shortness of breath.  She denies any fever or sore throat. She says she has been taking cold medication OTC.  She says that she was taking tessalon perls that she had left over from previous illness. Recommend she start taking zyrtec or Claritin, Flonase and mucinex.   Fatigue: She reports she has felt fatigued over the last few months.  She reports that she was feeling fatigued even before she got sick.  Patient states she just feels rundown.  Patient states she is getting plenty of sleep.  Patient denies any depression.  We will get labs including B12 vitamin D and CBC.  Vaginal discharge: She reports vaginal discharge for the last few days she says she tried a one a day and feels like it has gotten worse.  Will obtain a vaginal swab.  She thinks that might be a yeast infection.  We will send in Diflucan.  Overweight: Current weight is 167 lbs, with a BMI of 29.69.  She says her highest weight was 189 lbs.  She has tried phentermine in the past . She says she lost about 8 lbs.  Patient would like to try phentermine again.  She does eat a well-balanced diet and works out at 44 at least 3 times a week for thirty minutes cardio and then weight machines.  Patient states she is always struggled with her weight.  Discussed other obesity medications.  Patient is aware that they are on backorder.  Anxiety: Patient is currently taking BuSpar 5 mg 3 times a day.  Patient  reports she is doing pretty well on this.  We will continue with current treatment.    10/20/2022    1:04 PM 08/03/2022    3:56 PM  GAD 7 : Generalized Anxiety Score  Nervous, Anxious, on Edge 0 1  Control/stop worrying 0 1  Worry too much - different things 0 1  Trouble relaxing 0 1  Restless 0 1  Easily annoyed or irritable 0 1  Afraid - awful might happen 0 1  Total GAD 7 Score 0 7  Anxiety Difficulty Not difficult at all Not difficult at all        10/20/2022    1:03 PM 08/03/2022    3:34 PM 01/16/2022   11:12 AM 05/09/2021    2:22 PM 04/07/2021    8:21 AM  Depression screen PHQ 2/9  Decreased Interest 0 0 0 0 0  Down, Depressed, Hopeless 0 0 0 0 0  PHQ - 2 Score 0 0 0 0 0  Altered sleeping 0 0 0 0 0  Tired, decreased energy 1 0 0 0 0  Change in appetite 0 0 0 0 0  Feeling bad or failure about yourself  0 0 0 0 0  Trouble concentrating 0 0 0 0 0  Moving slowly or fidgety/restless 0 0 0 0 0  Suicidal thoughts 0 0 0 0 0  PHQ-9 Score 1 0 0 0 0  Difficult doing work/chores  Not difficult at all Not difficult at all Not difficult at all Not difficult at all    Relevant past medical, surgical, family and social history reviewed and updated as indicated. Interim medical history since our last visit reviewed. Allergies and medications reviewed and updated.  Review of Systems  Constitutional: Negative for fever or weight change.  Positive for fatigue HEENT: Positive for nasal congestion Respiratory: Positive for cough and shortness of breath.   Cardiovascular: Negative for chest pain or palpitations.  Gastrointestinal: Negative for abdominal pain, no bowel changes.  GU: Positive for vaginal discharge Musculoskeletal: Negative for gait problem or joint swelling.  Skin: Negative for rash.  Neurological: Negative for dizziness or headache.  No other specific complaints in a complete review of systems (except as listed in HPI above).      Objective:    BP 122/70   Pulse 81    Temp 97.8 F (36.6 C)   Resp 16   Ht 5\' 3"  (1.6 m)   Wt 167 lb 9.6 oz (76 kg)   SpO2 98%   BMI 29.69 kg/m   Wt Readings from Last 3 Encounters:  10/20/22 167 lb 9.6 oz (76 kg)  09/01/22 164 lb (74.4 kg)  08/03/22 163 lb 9.6 oz (74.2 kg)    Physical Exam  Constitutional: Patient appears well-developed and well-nourished. Obese  No distress.  HEENT: head atraumatic, normocephalic, pupils equal and reactive to light, ears Tms clear, neck supple, throat within normal limits Cardiovascular: Normal rate, regular rhythm and normal heart sounds.  No murmur heard. No BLE edema. Pulmonary/Chest: Effort normal and breath sounds normal. No respiratory distress. Abdominal: Soft.  There is no tenderness. Psychiatric: Patient has a normal mood and affect. behavior is normal. Judgment and thought content normal.  Results for orders placed or performed in visit on 09/01/22  Factor V leiden  Result Value Ref Range   Recommendations-F5LEID: Comment (A)    Reviewed By: Comment       Assessment & Plan:   Problem List Items Addressed This Visit       Other   Class 1 obesity with body mass index (BMI) of 33.0 to 33.9 in adult    Patient has previously been on phentermine.  Patient is requesting to restart phentermine at this time.  Patient's current weight is 167 pounds with a BMI of 29.69.  Discussed side effects with patient also discussed with patient not starting medication until she is recovered from URI.      Relevant Medications   phentermine (ADIPEX-P) 37.5 MG tablet   Anxiety    Doing well with BuSpar 5 mg 3 times daily.  Continue with current treatment plan.      Relevant Medications   busPIRone (BUSPAR) 5 MG tablet   Other Visit Diagnoses     Viral upper respiratory tract infection    -  Primary   Take Zyrtec or Claritin, Flonase and Mucinex.  Push fluids and get rest.   Relevant Medications   fluconazole (DIFLUCAN) 150 MG tablet   Vaginal discharge       Start Diflucan.   Vaginal swab obtained.   Relevant Medications   fluconazole (DIFLUCAN) 150 MG tablet   Other Relevant Orders   Cervicovaginal ancillary  only   Other fatigue       Patient reports ongoing fatigue.  We will get labs including CBC, vitamin D, vitamin B-12 and TSH.   Relevant Orders   TSH   VITAMIN D 25 Hydroxy (Vit-D Deficiency, Fractures)   Vitamin B12   CBC with Differential/Platelet        Follow up plan: Return in about 3 months (around 01/20/2023) for follow up.

## 2022-10-20 NOTE — Assessment & Plan Note (Signed)
Doing well with BuSpar 5 mg 3 times daily.  Continue with current treatment plan.

## 2022-10-20 NOTE — Assessment & Plan Note (Signed)
Patient has previously been on phentermine.  Patient is requesting to restart phentermine at this time.  Patient's current weight is 167 pounds with a BMI of 29.69.  Discussed side effects with patient also discussed with patient not starting medication until she is recovered from URI.

## 2022-10-21 LAB — CBC WITH DIFFERENTIAL/PLATELET
Absolute Monocytes: 413 cells/uL (ref 200–950)
Basophils Absolute: 78 cells/uL (ref 0–200)
Basophils Relative: 1 %
Eosinophils Absolute: 250 cells/uL (ref 15–500)
Eosinophils Relative: 3.2 %
HCT: 37.1 % (ref 35.0–45.0)
Hemoglobin: 12.7 g/dL (ref 11.7–15.5)
Lymphs Abs: 1599 cells/uL (ref 850–3900)
MCH: 29.7 pg (ref 27.0–33.0)
MCHC: 34.2 g/dL (ref 32.0–36.0)
MCV: 86.7 fL (ref 80.0–100.0)
MPV: 10.2 fL (ref 7.5–12.5)
Monocytes Relative: 5.3 %
Neutro Abs: 5460 cells/uL (ref 1500–7800)
Neutrophils Relative %: 70 %
Platelets: 294 10*3/uL (ref 140–400)
RBC: 4.28 10*6/uL (ref 3.80–5.10)
RDW: 13.1 % (ref 11.0–15.0)
Total Lymphocyte: 20.5 %
WBC: 7.8 10*3/uL (ref 3.8–10.8)

## 2022-10-21 LAB — TSH: TSH: 1.18 mIU/L

## 2022-10-21 LAB — VITAMIN B12: Vitamin B-12: 774 pg/mL (ref 200–1100)

## 2022-10-22 LAB — CERVICOVAGINAL ANCILLARY ONLY
Bacterial Vaginitis (gardnerella): NEGATIVE
Candida Glabrata: NEGATIVE
Candida Vaginitis: NEGATIVE
Chlamydia: NEGATIVE
Comment: NEGATIVE
Comment: NEGATIVE
Comment: NEGATIVE
Comment: NEGATIVE
Comment: NEGATIVE
Comment: NORMAL
Neisseria Gonorrhea: NEGATIVE
Trichomonas: NEGATIVE

## 2023-01-20 ENCOUNTER — Ambulatory Visit: Payer: BC Managed Care – PPO | Admitting: Nurse Practitioner

## 2023-03-30 NOTE — Progress Notes (Unsigned)
Name: Angela Irwin   MRN: [id number]    DOB: 05-Jul-1971   Date:03/31/2023       Progress Note  Subjective  Chief Complaint  Chief Complaint  Patient presents with   Annual Exam    HPI  Patient presents for annual CPE and followup. Aware of possible additional charge.   Insomnia:  she has tried trazodone she says it kept her awake. will trial ambien.   Overweight: she would like a refill of phentermine. Discussed this is for short term use. Patient verbalized understanding.  Will send in prescription. Her current weight is 155 lbs with a BMI of 27.62.   Urinary frequency: she reports urinary frequency for about a week. No fever or back pain will check urine dip and vaginal swab.   Diet: well balanced diet Exercise: nothing, she is working, she says she is getting plenty of steps.  Recommend increasing physical activity  Sleep: some nights 3-4 some 6-7  Last dental exam:last year Last eye exam: was [place] Office Visit from 03/31/2023 in [facility]  AUDIT-C Score 0      Depression: Phq 9 is  negative    10/20/2022    1:03 PM 08/03/2022    3:34 PM 01/16/2022   11:12 AM 05/09/2021    2:22 PM 04/07/2021    8:21 AM  Depression screen PHQ 2/9  Decreased Interest 0 0 0 0 0  Down, Depressed, Hopeless 0 0 0 0 0  PHQ - 2 Score 0 0 0 0 0  Altered sleeping 0 0 0 0 0  Tired, decreased energy 1 0 0 0 0  Change in appetite 0 0 0 0 0  Feeling bad or failure about yourself  0 0 0 0 0  Trouble concentrating 0 0 0 0 0  Moving slowly or fidgety/restless 0 0 0 0 0  Suicidal thoughts 0 0 0 0 0  PHQ-9 Score 1 0 0 0 0  Difficult doing work/chores  Not difficult at all Not difficult at all Not difficult at all Not difficult at all   Hypertension: BP Readings from Last 3 Encounters:  03/31/23 120/80  10/20/22 122/70  09/01/22 126/78   Obesity: Wt Readings from Last 3 Encounters:  03/31/23 155 lb 14.4 oz (70.7 kg)  10/20/22 167 lb 9.6 oz (76 kg)   09/01/22 164 lb (74.4 kg)   BMI Readings from Last 3 Encounters:  03/31/23 27.62 kg/m  10/20/22 29.69 kg/m  09/01/22 29.05 kg/m     Vaccines:  HPV: up to at age 67 , ask insurance if age between 32-45  Shingrix: 25-64 yo and ask insurance if covered when patient above 23 yo Pneumonia:  educated and discussed with patient. Flu:  educated and discussed with patient.  Hep C Screening: 04/07/2021 STD testing and prevention (HIV/chl/gon/syphilis): 04/07/2021 Intimate partner violence:none Sexual History : yes Menstrual History/LMP/Abnormal Bleeding: [facility]: 03/14/2023 Incontinence Symptoms:   Breast cancer:  - Last Mammogram: 06/05/2021, ordered at previous visit - BRCA gene screening: none  Osteoporosis: Discussed high calcium and vitamin D supplementation, weight bearing exercises  Cervical cancer screening: 12/04/2019  Skin cancer: Discussed monitoring for atypical lesions  Colorectal cancer: 05/30/2021   Lung cancer:   Low Dose CT Chest recommended if Age 38-80 years, 20 pack-year currently smoking OR have quit w/in 15years. Patient does not qualify.   ECG: none  Advanced Care Planning: A voluntary discussion about advance care planning including the explanation and discussion of advance  directives.  Discussed health care proxy and Living will, and the patient was able to identify a health care proxy as husband .  Patient does not have a living will at present time. If patient does have living will, I have requested they bring this to the clinic to be scanned in to their chart.  Lipids: Lab Results  Component Value Date   CHOL 239 (H) 08/03/2022   CHOL 252 (H) 04/07/2021   CHOL 269 (H) 12/04/2019   Lab Results  Component Value Date   HDL 57 08/03/2022   HDL 57 04/07/2021   HDL 52 12/04/2019   Lab Results  Component Value Date   LDLCALC 155 (H) 08/03/2022   LDLCALC 175 (H) 04/07/2021   LDLCALC 192 (H) 12/04/2019   Lab Results  Component Value Date   TRIG 147  08/03/2022   TRIG 86 04/07/2021   TRIG 122 12/04/2019   Lab Results  Component Value Date   CHOLHDL 4.2 08/03/2022   CHOLHDL 4.4 04/07/2021   CHOLHDL 5.2 (H) 12/04/2019   No results found for: "LDLDIRECT"  Glucose: Glucose, Bld  Date Value Ref Range Status  08/03/2022 92 65 - 99 mg/dL Final    Comment:    .            Fasting reference interval .   04/07/2021 94 65 - 99 mg/dL Final    Comment:    .            Fasting reference interval .   12/04/2019 94 65 - 99 mg/dL Final    Comment:    .            Fasting reference interval .     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 11/10/2019   History of fracture of left ankle 11/10/2019   MASTALGIA 05/27/2010   BACK PAIN, LUMBAR, CHRONIC 05/27/2010    Past Surgical History:  Procedure Laterality Date   COLONOSCOPY WITH PROPOFOL N/A 05/30/2021   Procedure: COLONOSCOPY WITH PROPOFOL;  Surgeon: Virgel Manifold, MD;  Location: [facility] ENDOSCOPY;  Service: Endoscopy;  Laterality: N/A;   DILATION AND CURETTAGE OF UTERUS      Family History  Problem Relation Age of Onset   Heart attack Mother 83   Hyperlipidemia Mother    Hypertension Mother    Kidney disease Mother    Breast cancer Maternal Grandmother    Cancer Maternal Grandmother    Lung cancer Maternal Grandfather    Autism Brother     Social History   Socioeconomic History   Marital status:  Married    Spouse name: Darnelle Maffucci   Number of children: 2   Years of education: 12   Highest education level: Some college, no degree  Occupational History   Occupation: homemaker  Tobacco Use   Smoking status: Former   Smokeless tobacco: Never  Media planner  Vaping Use: Some days   Substances: Flavoring  Substance and Sexual Activity   Alcohol use: Yes    Alcohol/week: 1.0 standard drink of alcohol    Types: 1 Glasses of wine per week    Comment: socially   Drug use: Not Currently   Sexual activity: Yes    Birth control/protection: Surgical    Comment: Tubes tied  Other Topics Concern   Not on file  Social History Narrative   Not on file   Social Determinants of Health   Financial Resource Strain: Low Risk  (03/30/2023)   Overall Financial Resource Strain (CARDIA)    Difficulty of Paying Living Expenses: Not hard at all  Food Insecurity: No Food Insecurity (03/30/2023)   Hunger Vital Sign    Worried About Running Out of Food in the Last Year: Never true    Ran Out of Food in the Last Year: Never true  Transportation Needs: No Transportation Needs (03/30/2023)   PRAPARE - Hydrologist (Medical): No    Lack of Transportation (Non-Medical): No  Physical Activity: Inactive (03/31/2023)   Exercise Vital Sign    Days of Exercise per Week: 0 days    Minutes of Exercise per Session: 0 min  Stress: No Stress Concern Present (03/30/2023)   [place]    Feeling of Stress : Not at all  Social Connections: [place] (03/30/2023)   Social Connection and Isolation Panel [NHANES]    Frequency of Communication with Friends and Family: More than three times a week    Frequency of Social Gatherings with Friends and Family: Twice a week    Attends Religious Services: More than 4 times per year    Active Member of [employer] or Organizations: Yes    Attends Music therapist: More than 4  times per year    Marital Status: Married  Human resources officer Violence: Not At Risk (03/31/2023)   Humiliation, Afraid, Rape, and Kick questionnaire    Fear of Current or Ex-Partner: No    Emotionally Abused: No    Physically Abused: No    Sexually Abused: No     Current Outpatient Medications:    busPIRone (BUSPAR) 5 MG tablet, Take 1 tablet (5 mg total) by mouth 3 (three) times daily as needed., Disp: 90 tablet, Rfl: 1   fluconazole (DIFLUCAN) 150 MG tablet, Take 1 tablet (150 mg total) by mouth every 3 (three) days as needed (for vaginal itching/yeast infection sx). (Patient not taking: Reported on 03/31/2023), Disp: 2 tablet, Rfl: 0   phentermine (ADIPEX-P) 37.5 MG tablet, Take 1 tablet (37.5 mg total) by mouth daily before breakfast. (Patient not taking: Reported on 03/31/2023), Disp: 30 tablet, Rfl: 2  Allergies  Allergen Reactions   Promethazine Hcl     REACTION: gi upset     ROS  Constitutional: Negative for fever or weight change.  Respiratory: Negative for cough and shortness of breath.   Cardiovascular: Negative for chest pain or palpitations.  Gastrointestinal: Negative for abdominal pain, no bowel changes.  Musculoskeletal: Negative for gait problem or joint swelling.  Skin: Negative for rash.  Neurological: Negative for dizziness or headache.  No other specific complaints in a complete review of systems (except as listed in HPI above).   Objective  Vitals:   03/31/23 1139  BP: 120/80  Pulse: 98  Resp: 16  Temp: 97.7 F (36.5 C)  TempSrc: Oral  SpO2: 98%  Weight: 155  lb 14.4 oz (70.7 kg)  Height: 5\' 3"  (1.6 m)    Body mass index is 27.62 kg/m.  Physical Exam Constitutional: Patient appears well-developed and well-nourished. No distress.  HENT: Head: Normocephalic and atraumatic. Ears: B TMs ok, no erythema or effusion; Nose: Nose normal. Mouth/Throat: Oropharynx is clear and moist. No oropharyngeal exudate.  Eyes: Conjunctivae and EOM are normal. Pupils are  equal, round, and reactive to light. No scleral icterus.  Neck: Normal range of motion. Neck supple. No JVD present. No thyromegaly present.  Cardiovascular: Normal rate, regular rhythm and normal heart sounds.  No murmur heard. No BLE edema. Pulmonary/Chest: Effort normal and breath sounds normal. No respiratory distress. Abdominal: Soft. Bowel sounds are normal, no distension. There is no tenderness. no masses Breast: no lumps or masses, no nipple discharge or rashes FEMALE GENITALIA:  External genitalia normal External urethra normal Vaginal vault normal without discharge or lesions Cervix normal without discharge or lesions Bimanual exam normal without masses RECTAL: no rectal masses or hemorrhoids Musculoskeletal: Normal range of motion, no joint effusions. No gross deformities Neurological: he is alert and oriented to person, place, and time. No cranial nerve deficit. Coordination, balance, strength, speech and gait are normal.  Skin: Skin is warm and dry. No rash noted. No erythema.  Psychiatric: Patient has a normal mood and affect. behavior is normal. Judgment and thought content normal.   No results found for this or any previous visit (from the past 2160 hour(s)).   Fall Risk:    03/31/2023   11:38 AM 10/20/2022    1:00 PM 08/03/2022    3:33 PM 01/16/2022   11:12 AM 05/09/2021    2:22 PM  Fall Risk   Falls in the past year? 0 0 0 0 0  Number falls in past yr: 0 0 0 0 0  Injury with Fall? 0 0 0 0 0  Risk for fall due to :   No Fall Risks No Fall Risks   Follow up  Falls evaluation completed Falls prevention discussed;Education provided Falls prevention discussed      Functional Status Survey: Is the patient deaf or have difficulty hearing?: No Does the patient have difficulty seeing, even when wearing glasses/contacts?: No Does the patient have difficulty concentrating, remembering, or making decisions?: No Does the patient have difficulty walking or climbing stairs?:  No Does the patient have difficulty dressing or bathing?: No Does the patient have difficulty doing errands alone such as visiting a doctor's office or shopping?: No   Assessment & Plan  1. Annual physical exam Increase physical activity - CBC with Differential/Platelet - COMPLETE METABOLIC PANEL WITH GFR - Lipid panel - Hemoglobin A1c - MM 3D SCREENING MAMMOGRAM BILATERAL BREAST; Future  2. Hyperlipidemia, unspecified hyperlipidemia type  - COMPLETE METABOLIC PANEL WITH GFR - Lipid panel  3. Screening for diabetes mellitus  - COMPLETE METABOLIC PANEL WITH GFR - Hemoglobin A1c  4. Encounter for screening mammogram for malignant neoplasm of breast  - MM 3D SCREENING MAMMOGRAM BILATERAL BREAST; Future  5. Screening for deficiency anemia  - CBC with Differential/Platelet  6. Other insomnia  - zolpidem (AMBIEN) 5 MG tablet; Take 1 tablet (5 mg total) by mouth at bedtime as needed for sleep.  Dispense: 15 tablet; Refill: 0  7. Urinary frequency Urine dip was negative, will await vaginal swab results.  - POCT urinalysis dipstick - Cervicovaginal ancillary only  8. Overweight (BMI 25.0-29.9) Continue working on lifestyle modification - phentermine (ADIPEX-P) 37.5 MG tablet; Take 1  tablet (37.5 mg total) by mouth daily before breakfast.  Dispense: 30 tablet; Refill: 2   -USPSTF grade A and B recommendations reviewed with patient; age-appropriate recommendations, preventive care, screening tests, etc discussed and encouraged; healthy living encouraged; see AVS for patient education given to patient -Discussed importance of 150 minutes of physical activity weekly, eat two servings of fish weekly, eat one serving of tree nuts ( cashews, pistachios, pecans, almonds.Marland Kitchen) every other day, eat 6 servings of fruit/vegetables daily and drink plenty of water and avoid sweet beverages.

## 2023-03-31 ENCOUNTER — Encounter: Payer: Self-pay | Admitting: Nurse Practitioner

## 2023-03-31 ENCOUNTER — Other Ambulatory Visit (HOSPITAL_COMMUNITY)
Admission: RE | Admit: 2023-03-31 | Discharge: 2023-03-31 | Disposition: A | Discharge status: Home / Self Care | Payer: BC Managed Care – PPO | Source: Ambulatory Visit | Attending: Nurse Practitioner | Admitting: Nurse Practitioner

## 2023-03-31 ENCOUNTER — Ambulatory Visit (INDEPENDENT_AMBULATORY_CARE_PROVIDER_SITE_OTHER): Payer: BC Managed Care – PPO | Admitting: Nurse Practitioner

## 2023-03-31 ENCOUNTER — Other Ambulatory Visit: Payer: Self-pay

## 2023-03-31 VITALS — BP 120/80 | HR 98 | Temp 97.7°F | Resp 16 | Ht 63.0 in | Wt 155.9 lb

## 2023-03-31 DIAGNOSIS — E785 Hyperlipidemia, unspecified: Secondary | ICD-10-CM | POA: Diagnosis not present

## 2023-03-31 DIAGNOSIS — Z131 Encounter for screening for diabetes mellitus: Secondary | ICD-10-CM | POA: Diagnosis not present

## 2023-03-31 DIAGNOSIS — Z1231 Encounter for screening mammogram for malignant neoplasm of breast: Secondary | ICD-10-CM

## 2023-03-31 DIAGNOSIS — Z0000 Encounter for general adult medical examination without abnormal findings: Secondary | ICD-10-CM

## 2023-03-31 DIAGNOSIS — G4709 Other insomnia: Secondary | ICD-10-CM

## 2023-03-31 DIAGNOSIS — R350 Frequency of micturition: Secondary | ICD-10-CM | POA: Insufficient documentation

## 2023-03-31 DIAGNOSIS — Z130 Encounter for screening for diseases of the blood and blood-forming organs and certain disorders involving the immune mechanism: Secondary | ICD-10-CM | POA: Diagnosis not present

## 2023-03-31 DIAGNOSIS — E663 Overweight: Secondary | ICD-10-CM

## 2023-03-31 LAB — POCT URINALYSIS DIPSTICK
Bilirubin, UA: NEGATIVE
Blood, UA: NEGATIVE
Glucose, UA: NEGATIVE
Ketones, UA: NEGATIVE
Leukocytes, UA: NEGATIVE
Nitrite, UA: NEGATIVE
Protein, UA: NEGATIVE
Spec Grav, UA: 1.02 (ref 1.010–1.025)
Urobilinogen, UA: 0.2 E.U./dL
pH, UA: 5 (ref 5.0–8.0)

## 2023-03-31 MED ORDER — PHENTERMINE HCL 37.5 MG PO TABS: 37.5000 mg | ORAL_TABLET | Freq: Every day | ORAL | 2 refills | Status: DC

## 2023-03-31 MED ORDER — ZOLPIDEM TARTRATE 5 MG PO TABS: 5.0000 mg | ORAL_TABLET | Freq: Every evening | ORAL | 0 refills | Status: DC | PRN

## 2023-04-01 LAB — COMPLETE METABOLIC PANEL WITH GFR
AG Ratio: 1.8 (calc) (ref 1.0–2.5)
ALT: 14 U/L (ref 6–29)
AST: 17 U/L (ref 10–35)
Albumin: 4.4 g/dL (ref 3.6–5.1)
Alkaline phosphatase (APISO): 38 U/L (ref 37–153)
BUN: 9 mg/dL (ref 7–25)
CO2: 26 mmol/L (ref 20–32)
Calcium: 9.3 mg/dL (ref 8.6–10.4)
Chloride: 102 mmol/L (ref 98–110)
Creat: 0.75 mg/dL (ref 0.50–1.03)
Globulin: 2.5 g/dL (calc) (ref 1.9–3.7)
Glucose, Bld: 90 mg/dL (ref 65–99)
Potassium: 4.4 mmol/L (ref 3.5–5.3)
Sodium: 138 mmol/L (ref 135–146)
Total Bilirubin: 0.4 mg/dL (ref 0.2–1.2)
Total Protein: 6.9 g/dL (ref 6.1–8.1)
eGFR: 97 mL/min/{1.73_m2} (ref >=60)

## 2023-04-01 LAB — CBC WITH DIFFERENTIAL/PLATELET
Absolute Monocytes: 454 cells/uL (ref 200–950)
Basophils Absolute: 99 cells/uL (ref 0–200)
Basophils Relative: 1.4 %
Eosinophils Absolute: 412 cells/uL (ref 15–500)
Eosinophils Relative: 5.8 %
HCT: 38.7 % (ref 35.0–45.0)
Hemoglobin: 13.1 g/dL (ref 11.7–15.5)
Lymphs Abs: 1824.7 cells/uL (ref 850–3900)
MCH: 29.8 pg (ref 27.0–33.0)
MCHC: 33.9 g/dL (ref 32.0–36.0)
MCV: 88 fL (ref 80.0–100.0)
MPV: 10 fL (ref 7.5–12.5)
Monocytes Relative: 6.4 %
Neutro Abs: 4310 cells/uL (ref 1500–7800)
Neutrophils Relative %: 60.7 %
Platelets: 375 10*3/uL (ref 140–400)
RBC: 4.4 10*6/uL (ref 3.80–5.10)
RDW: 12.8 % (ref 11.0–15.0)
Total Lymphocyte: 25.7 %
WBC: 7.1 10*3/uL (ref 3.8–10.8)

## 2023-04-01 LAB — LIPID PANEL
Cholesterol: 217 mg/dL — ABNORMAL HIGH (ref <200)
HDL: 55 mg/dL (ref >=50)
LDL Cholesterol (Calc): 142 mg/dL (calc) — ABNORMAL HIGH
Non-HDL Cholesterol (Calc): 162 mg/dL (calc) — ABNORMAL HIGH (ref <130)
Total CHOL/HDL Ratio: 3.9 (calc) (ref <5.0)
Triglycerides: 90 mg/dL (ref <150)

## 2023-04-01 LAB — HEMOGLOBIN A1C
Hgb A1c MFr Bld: 5.5 % of total Hgb (ref <5.7)
Mean Plasma Glucose: 111 mg/dL
eAG (mmol/L): 6.2 mmol/L

## 2023-04-02 LAB — CERVICOVAGINAL ANCILLARY ONLY
Comment: NEGATIVE
Comment: NEGATIVE
Comment: NEGATIVE
Comment: NEGATIVE
Comment: NEGATIVE
Comment: NORMAL

## 2023-04-06 ENCOUNTER — Ambulatory Visit: Payer: BC Managed Care – PPO | Admitting: Nurse Practitioner

## 2023-04-14 ENCOUNTER — Ambulatory Visit: Payer: BC Managed Care – PPO | Admitting: Nurse Practitioner

## 2023-05-26 ENCOUNTER — Ambulatory Visit
Admission: RE | Admit: 2023-05-26 | Discharge: 2023-05-26 | Disposition: A | Discharge status: Home / Self Care | Payer: BC Managed Care – PPO | Source: Ambulatory Visit | Attending: Nurse Practitioner | Admitting: Nurse Practitioner

## 2023-05-26 DIAGNOSIS — Z0000 Encounter for general adult medical examination without abnormal findings: Secondary | ICD-10-CM

## 2023-05-26 DIAGNOSIS — Z1231 Encounter for screening mammogram for malignant neoplasm of breast: Secondary | ICD-10-CM | POA: Diagnosis not present

## 2023-08-09 ENCOUNTER — Encounter: Payer: BC Managed Care – PPO | Admitting: Nurse Practitioner

## 2023-08-27 ENCOUNTER — Other Ambulatory Visit: Payer: Self-pay | Admitting: Medical Genetics

## 2023-08-27 DIAGNOSIS — Z006 Encounter for examination for normal comparison and control in clinical research program: Secondary | ICD-10-CM

## 2024-06-28 ENCOUNTER — Telehealth: Payer: Self-pay | Admitting: Pharmacy Technician

## 2024-06-28 ENCOUNTER — Other Ambulatory Visit (HOSPITAL_COMMUNITY): Payer: Self-pay

## 2024-06-28 ENCOUNTER — Ambulatory Visit (INDEPENDENT_AMBULATORY_CARE_PROVIDER_SITE_OTHER): Admitting: Nurse Practitioner

## 2024-06-28 ENCOUNTER — Encounter: Payer: Self-pay | Admitting: Nurse Practitioner

## 2024-06-28 VITALS — BP 118/72 | HR 93 | Temp 98.3°F | Resp 16 | Ht 63.0 in | Wt 164.8 lb

## 2024-06-28 DIAGNOSIS — Z131 Encounter for screening for diabetes mellitus: Secondary | ICD-10-CM

## 2024-06-28 DIAGNOSIS — Z130 Encounter for screening for diseases of the blood and blood-forming organs and certain disorders involving the immune mechanism: Secondary | ICD-10-CM | POA: Diagnosis not present

## 2024-06-28 DIAGNOSIS — Z1231 Encounter for screening mammogram for malignant neoplasm of breast: Secondary | ICD-10-CM | POA: Diagnosis not present

## 2024-06-28 DIAGNOSIS — E663 Overweight: Secondary | ICD-10-CM | POA: Insufficient documentation

## 2024-06-28 DIAGNOSIS — Z0000 Encounter for general adult medical examination without abnormal findings: Secondary | ICD-10-CM

## 2024-06-28 DIAGNOSIS — Z13220 Encounter for screening for lipoid disorders: Secondary | ICD-10-CM

## 2024-06-28 MED ORDER — WEGOVY 0.25 MG/0.5ML ~~LOC~~ SOAJ
0.2500 mg | SUBCUTANEOUS | 0 refills | Status: DC
Start: 2024-06-28 — End: 2024-07-20

## 2024-06-28 NOTE — Telephone Encounter (Signed)
 Pharmacy Patient Advocate Encounter   Received notification from CoverMyMeds that prior authorization for [facility] 0.25MG /0.5ML auto-injectors is required/requested.   Insurance verification completed.   The patient is insured through [facility] .   Per test claim: PA required; PA submitted to above mentioned insurance via CoverMyMeds Key/confirmation #/EOC [facility] Status is pending

## 2024-06-28 NOTE — Progress Notes (Signed)
 Name: Angela Irwin   MRN: [id number]    DOB: [birth year]   Date:06/28/2024       Progress Note  Subjective  Chief Complaint  Chief Complaint  Patient presents with   Annual Exam    HPI  Discussed the use of AI scribe software for clinical note transcription with the patient, who gave verbal consent to proceed.  History of Present Illness Angela Irwin is a 53 year old female who presents for a routine follow-up and discussion about weight management.  She is interested in weight loss medications and is attempting to confirm insurance coverage, which may cover all but $100 a month. She has used a medication intermittently while in [place]  but finds it expensive and difficult to obtain consistently. She has gained about two pounds since her last visit and is aware that her BMI places her in the overweight category, though she dislikes the BMI system.  Her diet includes a 20-gram protein yogurt with blueberries and granola for breakfast. She is resuming gym activities and has purchased a bike for home use. She experiences irregular sleep patterns, sleeping between three to six hours per night, and has tried various methods to regulate her sleep without success.  She reports constipation, which is unusual for her, but manages it with fiber pills and ensures adequate water intake, drinking coffee and about four thousand bowls of water a day. No issues with depression, violence at home, or incontinence. She is sexually active and her last menstrual cycle was on June 17, 2024.  Her last eye exam was last year, and she does not have a living will yet but has considered it. She uses [facility] on [address] for her prescriptions.   Patient presents for annual CPE.  Diet: well balanced diet Exercise: going to start biking,  going back to the gym  Sleep: all over the place Last dental exam:due Last eye exam: last year  Starting weight: 164 lbs Starting BMI: 29.19 Waist Measurement : 36 inches  (inches)   - Encourage continuation of lifestyle modifications, including dietary management and regular exercise. -continue to increase physical activity, getting at least 150 min of physical activity a week.  Work on including Runner, broadcasting/film/video 2 days a week.  - continue eating at a calorie deficit 1600-1700 cal a day, eating a well balanced diet with whole foods, avoiding processed foods.     Flowsheet Row Office Visit from 06/28/2024 in [facility]  AUDIT-C Score 1    Depression: Phq 9 is  negative    06/28/2024    8:23 AM 03/31/2023   12:04 PM 10/20/2022    1:03 PM 08/03/2022    3:34 PM 01/16/2022   11:12 AM  Depression screen PHQ 2/9  Decreased Interest 0 0 0 0 0  Down, Depressed, Hopeless 0 0 0 0 0  PHQ - 2 Score 0 0 0 0 0  Altered sleeping 0 1 0 0 0  Tired, decreased energy 0 1 1 0 0  Change in appetite 0 0 0 0 0  Feeling bad or failure about yourself  0 0 0 0 0  Trouble concentrating 0 0 0 0 0  Moving slowly or fidgety/restless 0 0 0 0 0  Suicidal thoughts 0 0 0 0 0  PHQ-9 Score 0 2 1 0 0  Difficult doing work/chores Not difficult at all Not difficult at all  Not difficult at all Not difficult at all   Hypertension: BP Readings from Last  3 Encounters:  06/28/24 118/72  03/31/23 120/80  10/20/22 122/70   Obesity: Wt Readings from Last 3 Encounters:  06/28/24 164 lb 12.8 oz (74.8 kg)  03/31/23 155 lb 14.4 oz (70.7 kg)  10/20/22 167 lb 9.6 oz (76 kg)   BMI Readings from Last 3 Encounters:  06/28/24 29.19 kg/m  03/31/23 27.62 kg/m  10/20/22 29.69 kg/m     Vaccines:  HPV: up to at age 61 , ask insurance if age between 42-45  Shingrix: 68-64 yo and ask insurance if covered when patient above 71 yo Pneumonia:  educated and discussed with patient. Flu:  educated and discussed with patient.  Hep C Screening: completed STD testing and prevention (HIV/chl/gon/syphilis): completed Intimate partner violence:none Sexual History : sexually  active Menstrual History/LMP/Abnormal Bleeding: [facility]: 06/17/2024 Incontinence Symptoms: none  Breast cancer:  - Last Mammogram: 05/26/2023, due - BRCA gene screening: none  Osteoporosis: Discussed high calcium  and vitamin D supplementation, weight bearing exercises  Cervical cancer screening: 12/04/2019  Skin cancer: Discussed monitoring for atypical lesions  Colorectal cancer: 05/30/2021   Lung cancer:   Low Dose CT Chest recommended if Age 32-80 years, 20 pack-year currently smoking OR have quit w/in 15years. Patient does not qualify.   ECG: none  Advanced Care Planning: A voluntary discussion about advance care planning including the explanation and discussion of advance directives.  Discussed health care proxy and Living will, and the patient was able to identify a health care proxy as husband.  Patient does not have a living will at present time. If patient does have living will, I have requested they bring this to the clinic to be scanned in to their chart.  Lipids: Lab Results  Component Value Date   CHOL 217 (H) 03/31/2023   CHOL 239 (H) 08/03/2022   CHOL 252 (H) 04/07/2021   Lab Results  Component Value Date   HDL 55 03/31/2023   HDL 57 08/03/2022   HDL 57 04/07/2021   Lab Results  Component Value Date   LDLCALC 142 (H) 03/31/2023   LDLCALC 155 (H) 08/03/2022   LDLCALC 175 (H) 04/07/2021   Lab Results  Component Value Date   TRIG 90 03/31/2023   TRIG 147 08/03/2022   TRIG 86 04/07/2021   Lab Results  Component Value Date   CHOLHDL 3.9 03/31/2023   CHOLHDL 4.2 08/03/2022   CHOLHDL 4.4 04/07/2021   No results found for: LDLDIRECT  Glucose: Glucose, Bld  Date Value Ref Range Status  03/31/2023 90 65 - 99 mg/dL Final    Comment:    .            Fasting reference interval .   08/03/2022 92 65 - 99 mg/dL Final    Comment:    .            Fasting reference interval .   04/07/2021 94 65 - 99 mg/dL Final    Comment:    .            Fasting reference  interval .     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) 05/09/2021   Coagulopathy (HCC) 05/09/2021   Hyperlipidemia 05/09/2021   Class 1 obesity  with body mass index (BMI) of 33.0 to 33.9 in adult 05/09/2021   Unintended weight gain 05/09/2021   Factor V Leiden (HCC) 11/10/2019   Glaucoma of both eyes 11/10/2019   Insomnia 11/10/2019   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 11/10/2019   History of fracture of left ankle 11/10/2019   MASTALGIA 05/27/2010   BACK PAIN, LUMBAR, CHRONIC 05/27/2010    Past Surgical History:  Procedure Laterality Date   COLONOSCOPY WITH PROPOFOL  N/A 05/30/2021   Procedure: COLONOSCOPY WITH PROPOFOL ;  Surgeon: Janalyn Keene NOVAK, MD;  Location: [facility] ENDOSCOPY;  Service: Endoscopy;  Laterality: N/A;   DILATION AND CURETTAGE OF UTERUS     TUBAL LIGATION  2016    Family History  Problem Relation Age of Onset   Heart attack Mother 42   Hyperlipidemia Mother    Hypertension Mother    Kidney disease Mother    Arthritis Mother    Heart disease Mother    Breast cancer Maternal Grandmother    Cancer Maternal Grandmother    Lung cancer Maternal Grandfather    Autism Brother     Social History   Socioeconomic History   Marital status: Married    Spouse name: Caron   Number of children: 2   Years of education: 12   Highest education level: Some college, no degree  Occupational History   Occupation: homemaker  Tobacco Use   Smoking status: Former   Smokeless tobacco: Never  Advertising account planner   Vaping status: Some Days   Substances: Flavoring  Substance and Sexual Activity   Alcohol use: Yes    Alcohol/week: 1.0 standard drink of alcohol     Types: 1 Glasses of wine per week    Comment: socially   Drug use: Not Currently   Sexual activity: Yes    Birth control/protection: Surgical    Comment: Tubes tied  Other Topics Concern   Not on file  Social History Narrative   Not on file   Social Drivers of Health   Financial Resource Strain: Low Risk  (06/25/2024)   Overall Financial Resource Strain (CARDIA)    Difficulty of Paying Living Expenses: Not hard at all  Food Insecurity: No Food Insecurity (06/25/2024)   Hunger Vital Sign    Worried About Running Out of Food in the Last Year: Never true    Ran Out of Food in the Last Year: Never true  Transportation Needs: No Transportation Needs (06/25/2024)   PRAPARE - Administrator, Civil Service (Medical): No    Lack of Transportation (Non-Medical): No  Physical Activity: Sufficiently Active (06/25/2024)   Exercise Vital Sign    Days of Exercise per Week: 3 days    Minutes of Exercise per Session: 60 min  Stress: No Stress Concern Present (06/25/2024)   [employer] of Occupational Health - Occupational Stress Questionnaire    Feeling of Stress: Not at all  Social Connections: Moderately Integrated (06/25/2024)   Social Connection and Isolation Panel    Frequency of Communication with Friends and Family: More than three times a week    Frequency of Social Gatherings with Friends and Family: Three times a week    Attends Religious Services: More than 4 times per year    Active Member of Clubs or Organizations: No    Attends Banker Meetings: Not on file    Marital Status: Married  Intimate Partner Violence: Not At Risk (06/28/2024)   Humiliation, Afraid, Rape, and Kick  questionnaire    Fear of Current or Ex-Partner: No    Emotionally Abused: No    Physically Abused: No    Sexually Abused: No     Current Outpatient Medications:    busPIRone  (BUSPAR ) 5 MG tablet, Take 1 tablet (5 mg total) by mouth 3 (three) times daily as needed., Disp: 90  tablet, Rfl: 1   WEGOVY 0.25 MG/0.5ML SOAJ, Inject 0.25 mg into the skin once a week. Use this dose for 1 month (4 shots) and then increase to next higher dose., Disp: 2 mL, Rfl: 0   fluconazole  (DIFLUCAN ) 150 MG tablet, Take 1 tablet (150 mg total) by mouth every 3 (three) days as needed (for vaginal itching/yeast infection sx). (Patient not taking: Reported on 06/28/2024), Disp: 2 tablet, Rfl: 0   phentermine  (ADIPEX-P ) 37.5 MG tablet, Take 1 tablet (37.5 mg total) by mouth daily before breakfast. (Patient not taking: Reported on 06/28/2024), Disp: 30 tablet, Rfl: 2   zolpidem  (AMBIEN ) 5 MG tablet, Take 1 tablet (5 mg total) by mouth at bedtime as needed for sleep. (Patient not taking: Reported on 06/28/2024), Disp: 15 tablet, Rfl: 0  Allergies  Allergen Reactions   Promethazine Hcl     REACTION: gi upset     ROS  Constitutional: Negative for fever or weight change.  Respiratory: Negative for cough and shortness of breath.   Cardiovascular: Negative for chest pain or palpitations.  Gastrointestinal: Negative for abdominal pain, no bowel changes.  Musculoskeletal: Negative for gait problem or joint swelling.  Skin: Negative for rash.  Neurological: Negative for dizziness or headache.  No other specific complaints in a complete review of systems (except as listed in HPI above).   Objective  Vitals:   06/28/24 0819  BP: 118/72  Pulse: 93  Resp: 16  Temp: 98.3 F (36.8 C)  SpO2: 98%  Weight: 164 lb 12.8 oz (74.8 kg)  Height: 5' 3 (1.6 m)    Body mass index is 29.19 kg/m.  Physical Exam Physical Exam MEASUREMENTS: Weight- 164, BMI- 20.0. GENERAL: Alert, cooperative, well developed, no acute distress. HEENT: Normocephalic, normal oropharynx, moist mucous membranes. CHEST: Clear to auscultation bilaterally, no wheezes, rhonchi, or crackles. CARDIOVASCULAR: Normal heart rate and rhythm, S1 and S2 normal without murmurs. ABDOMEN: Soft, non-tender, non-distended, without  organomegaly, normal bowel sounds. EXTREMITIES: No cyanosis or edema. NEUROLOGICAL: Cranial nerves grossly intact, moves all extremities without gross motor or sensory deficit, sensation symmetric and intact.    No results found for this or any previous visit (from the past 2160 hours).    Fall Risk:    06/28/2024    8:22 AM 03/31/2023   11:38 AM 10/20/2022    1:00 PM 08/03/2022    3:33 PM 01/16/2022   11:12 AM  Fall Risk   Falls in the past year? 0 0 0 0 0  Number falls in past yr: 0 0 0 0 0  Injury with Fall? 0 0 0 0 0  Risk for fall due to :    No Fall Risks No Fall Risks  Follow up Falls evaluation completed  Falls evaluation completed  Falls prevention discussed;Education provided  Falls prevention discussed      Data saved with a previous flowsheet row definition     Functional Status Survey: Is the patient deaf or have difficulty hearing?: No Does the patient have difficulty seeing, even when wearing glasses/contacts?: No Does the patient have difficulty concentrating, remembering, or making decisions?: No Does the patient have difficulty walking  or climbing stairs?: No Does the patient have difficulty dressing or bathing?: No Does the patient have difficulty doing errands alone such as visiting a doctor's office or shopping?: No   Assessment & Plan  Assessment and Plan Assessment & Plan Overweight Classified as overweight with a BMI of 28. High cholesterol. Actively managing weight through diet and exercise. Considering weight loss medications like Wegovy and Zepbound, but insurance coverage is a concern. - Submit prior authorization for Wegovy to [employer]. - If Georjean is not covered, consider submitting prior authorization for Zepbound. - Measure waist circumference for tracking purposes. - Encourage continuation of a balanced diet and regular exercise.  General Health Maintenance Pap smear deferred until next year. Mammogram due. Discussed importance  of regular health screenings and lifestyle modifications. - Schedule mammogram as per the card provided. - Encourage a balanced diet and regular physical activity.  Goals of Care Discussed importance of having a living will and designating a healthcare proxy. She has not yet completed a living will but acknowledges the importance. - Encourage completion of a living will and designation of a healthcare proxy.   Annual exam -labs ordered -will do pap next year -mammogram ordered 1. Annual physical exam (Primary)  - MM 3D SCREENING MAMMOGRAM BILATERAL BREAST; Future - CBC with Differential/Platelet - Comprehensive metabolic panel with GFR - Lipid panel - Hemoglobin A1c  2. Encounter for screening mammogram for malignant neoplasm of breast  - MM 3D SCREENING MAMMOGRAM BILATERAL BREAST; Future  3. Screening for cholesterol level  - Lipid panel  4. Screening for diabetes mellitus  - Comprehensive metabolic panel with GFR - Hemoglobin A1c  5. Screening for deficiency anemia  - CBC with Differential/Platelet  6. Overweight (BMI 25.0-29.9)  - TSH - WEGOVY 0.25 MG/0.5ML SOAJ; Inject 0.25 mg into the skin once a week. Use this dose for 1 month (4 shots) and then increase to next higher dose.  Dispense: 2 mL; Refill: 0   -USPSTF grade A and B recommendations reviewed with patient; age-appropriate recommendations, preventive care, screening tests, etc discussed and encouraged; healthy living encouraged; see AVS for patient education given to patient -Discussed importance of 150 minutes of physical activity weekly, eat two servings of fish weekly, eat one serving of tree nuts ( cashews, pistachios, pecans, almonds.SABRA) every other day, eat 6 servings of fruit/vegetables daily and drink plenty of water and avoid sweet beverages.   -Reviewed Health Maintenance: yes

## 2024-06-29 ENCOUNTER — Ambulatory Visit: Payer: Self-pay | Admitting: Nurse Practitioner

## 2024-06-29 ENCOUNTER — Other Ambulatory Visit (HOSPITAL_COMMUNITY): Payer: Self-pay

## 2024-06-29 LAB — CBC WITH DIFFERENTIAL/PLATELET
Absolute Lymphocytes: 1584 {cells}/uL (ref 850–3900)
Absolute Monocytes: 402 {cells}/uL (ref 200–950)
Basophils Absolute: 72 {cells}/uL (ref 0–200)
Basophils Relative: 1.2 %
Eosinophils Absolute: 414 {cells}/uL (ref 15–500)
Eosinophils Relative: 6.9 %
HCT: 37.8 % (ref 35.0–45.0)
Hemoglobin: 12.5 g/dL (ref 11.7–15.5)
MCH: 30.3 pg (ref 27.0–33.0)
MCHC: 33.1 g/dL (ref 32.0–36.0)
MCV: 91.7 fL (ref 80.0–100.0)
MPV: 10 fL (ref 7.5–12.5)
Monocytes Relative: 6.7 %
Neutro Abs: 3528 {cells}/uL (ref 1500–7800)
Neutrophils Relative %: 58.8 %
Platelets: 298 10*3/uL (ref 140–400)
RBC: 4.12 10*6/uL (ref 3.80–5.10)
RDW: 13.1 % (ref 11.0–15.0)
Total Lymphocyte: 26.4 %
WBC: 6 10*3/uL (ref 3.8–10.8)

## 2024-06-29 LAB — COMPREHENSIVE METABOLIC PANEL WITH GFR
AG Ratio: 1.9 (calc) (ref 1.0–2.5)
ALT: 28 U/L (ref 6–29)
AST: 21 U/L (ref 10–35)
Albumin: 4.3 g/dL (ref 3.6–5.1)
Alkaline phosphatase (APISO): 39 U/L (ref 37–153)
BUN: 19 mg/dL (ref 7–25)
CO2: 28 mmol/L (ref 20–32)
Calcium: 9.2 mg/dL (ref 8.6–10.4)
Chloride: 104 mmol/L (ref 98–110)
Creat: 0.72 mg/dL (ref 0.50–1.03)
Globulin: 2.3 g/dL (ref 1.9–3.7)
Glucose, Bld: 79 mg/dL (ref 65–99)
Potassium: 4.7 mmol/L (ref 3.5–5.3)
Sodium: 137 mmol/L (ref 135–146)
Total Bilirubin: 0.4 mg/dL (ref 0.2–1.2)
Total Protein: 6.6 g/dL (ref 6.1–8.1)
eGFR: 101 mL/min/{1.73_m2} (ref >=60)

## 2024-06-29 LAB — LIPID PANEL
Cholesterol: 225 mg/dL — ABNORMAL HIGH (ref <200)
HDL: 66 mg/dL (ref >=50)
LDL Cholesterol (Calc): 138 mg/dL — ABNORMAL HIGH
Non-HDL Cholesterol (Calc): 159 mg/dL — ABNORMAL HIGH (ref <130)
Total CHOL/HDL Ratio: 3.4 (calc) (ref <5.0)
Triglycerides: 100 mg/dL (ref <150)

## 2024-06-29 LAB — TSH: TSH: 2 m[IU]/L

## 2024-06-29 LAB — HEMOGLOBIN A1C
Hgb A1c MFr Bld: 5.2 % (ref <5.7)
Mean Plasma Glucose: 103 mg/dL
eAG (mmol/L): 5.7 mmol/L

## 2024-06-29 NOTE — Telephone Encounter (Signed)
 Pharmacy Patient Advocate Encounter  Received notification from [facility] that Prior Authorization for Wegovy 0.25MG /0.5ML auto-injectors has been APPROVED from 06/29/24 to 12/25/24. Ran test claim, Copay is $24.99. This test claim was processed through [facility]- copay amounts may vary at other pharmacies due to pharmacy/plan contracts, or as the patient moves through the different stages of their insurance plan.   PA #/Case ID/Reference #: [id number]

## 2024-07-20 ENCOUNTER — Other Ambulatory Visit: Payer: Self-pay | Admitting: Nurse Practitioner

## 2024-07-20 DIAGNOSIS — E663 Overweight: Secondary | ICD-10-CM

## 2024-07-20 DIAGNOSIS — Z6833 Body mass index (BMI) 33.0-33.9, adult: Secondary | ICD-10-CM

## 2024-07-20 MED ORDER — WEGOVY 0.5 MG/0.5ML ~~LOC~~ SOAJ
0.5000 mg | SUBCUTANEOUS | 0 refills | Status: DC
Start: 2024-07-20 — End: 2024-08-14

## 2024-07-21 NOTE — Telephone Encounter (Signed)
 Reordered 07/20/24 2 ml   Requested Prescriptions  Refused Prescriptions Disp Refills   WEGOVY  0.25 MG/0.5ML SOAJ [Pharmacy Med Name: Wegovy  0.25 MG/0.5ML Subcutaneous Solution Auto-injector] 4 mL 0    Sig: INJECT  0.25MG   SUBCUTANEOUSLY ONCE A WEEK USE THIS DOSE FOR 1 MONTH ( 4 SHOTS) THEN INCREASE TO THE NEXT HIGHER DOSE     Endocrinology:  Diabetes - GLP-1 Receptor Agonists - semaglutide  Passed - 07/21/2024  3:12 PM      Passed - HBA1C in normal range and within 180 days    Hgb A1c MFr Bld  Date Value Ref Range Status  06/28/2024 5.2 <5.7 % Final    Comment:    For the purpose of screening for the presence of diabetes: . <5.7%       Consistent with the absence of diabetes 5.7-6.4%    Consistent with increased risk for diabetes             (prediabetes) > or =6.5%  Consistent with diabetes . This assay result is consistent with a decreased risk of diabetes. . Currently, no consensus exists regarding use of hemoglobin A1c for diagnosis of diabetes in children. . According to American Diabetes Association (ADA) guidelines, hemoglobin A1c <7.0% represents optimal control in non-pregnant diabetic patients. Different metrics may apply to specific patient populations.  Standards of Medical Care in Diabetes(ADA). .          Passed - Cr in normal range and within 360 days    Creat  Date Value Ref Range Status  06/28/2024 0.72 0.50 - 1.03 mg/dL Final         Passed - Valid encounter within last 6 months    Recent Outpatient Visits           3 weeks ago Annual physical exam   [facility] Gareth Mliss [place], [place]

## 2024-08-14 ENCOUNTER — Other Ambulatory Visit: Payer: Self-pay | Admitting: Nurse Practitioner

## 2024-08-14 DIAGNOSIS — E66811 Obesity, class 1: Secondary | ICD-10-CM

## 2024-08-14 MED ORDER — WEGOVY 1 MG/0.5ML ~~LOC~~ SOAJ
1.0000 mg | SUBCUTANEOUS | 0 refills | Status: DC
Start: 2024-08-14 — End: 2024-09-08

## 2024-08-30 ENCOUNTER — Other Ambulatory Visit: Payer: Self-pay | Admitting: Nurse Practitioner

## 2024-08-30 DIAGNOSIS — R110 Nausea: Secondary | ICD-10-CM

## 2024-08-30 MED ORDER — ONDANSETRON 4 MG PO TBDP
4.0000 mg | ORAL_TABLET | Freq: Three times a day (TID) | ORAL | 0 refills | Status: AC | PRN
Start: 2024-08-30 — End: ?

## 2024-09-07 ENCOUNTER — Other Ambulatory Visit: Payer: Self-pay | Admitting: Nurse Practitioner

## 2024-09-07 DIAGNOSIS — Z6833 Body mass index (BMI) 33.0-33.9, adult: Secondary | ICD-10-CM

## 2024-09-08 NOTE — Telephone Encounter (Signed)
 Requested medication (s) are due for refill today:   Yes but at a higher dose    Being titrated  Requested medication (s) are on the active medication list:   Yes but at current dose   Future visit scheduled:   No   LOV 06/28/2024   Last ordered: 08/14/2024 2 ml, 0 refills  Unable to refill because this is being titrated.      Requested Prescriptions  Pending Prescriptions Disp Refills   WEGOVY  1 MG/0.5ML SOAJ SQ injection [Pharmacy Med Name: Wegovy  1 MG/0.5ML Subcutaneous Solution Auto-injector] 4 mL 0    Sig: INJECT ONE SYRINGEFUL INTO THE SKIN ONCE WEEKLY (AFTER ONE MONTH, INCREASE TO NEXT DOSE)     Endocrinology:  Diabetes - GLP-1 Receptor Agonists - semaglutide  Passed - 09/08/2024 10:07 AM      Passed - HBA1C in normal range and within 180 days    Hgb A1c MFr Bld  Date Value Ref Range Status  06/28/2024 5.2 <5.7 % Final    Comment:    For the purpose of screening for the presence of diabetes: . <5.7%       Consistent with the absence of diabetes 5.7-6.4%    Consistent with increased risk for diabetes             (prediabetes) > or =6.5%  Consistent with diabetes . This assay result is consistent with a decreased risk of diabetes. . Currently, no consensus exists regarding use of hemoglobin A1c for diagnosis of diabetes in children. . According to American Diabetes Association (ADA) guidelines, hemoglobin A1c <7.0% represents optimal control in non-pregnant diabetic patients. Different metrics may apply to specific patient populations.  Standards of Medical Care in Diabetes(ADA). .          Passed - Cr in normal range and within 360 days    Creat  Date Value Ref Range Status  06/28/2024 0.72 0.50 - 1.03 mg/dL Final         Passed - Valid encounter within last 6 months    Recent Outpatient Visits           2 months ago Annual physical exam   [facility] Gareth Mliss [place], [place]

## 2024-09-30 ENCOUNTER — Encounter: Payer: Self-pay | Admitting: Nurse Practitioner

## 2024-10-02 ENCOUNTER — Other Ambulatory Visit (HOSPITAL_COMMUNITY): Payer: Self-pay

## 2024-10-02 ENCOUNTER — Telehealth: Payer: Self-pay | Admitting: Pharmacy Technician

## 2024-10-02 NOTE — Telephone Encounter (Signed)
 PA request has been Received. New Encounter has been or will be created for follow up. For additional info see Pharmacy Prior Auth telephone encounter from 10/02/24.

## 2024-10-02 NOTE — Telephone Encounter (Signed)
 Pharmacy Patient Advocate Encounter   Received notification from Patient Advice Request messages that prior authorization for Wegovy  1 mg/0.5ml injection is required/requested.   Insurance verification completed.   The patient is insured through [facility] **.   Per test claim: Per test claim, medication is not covered due to plan/benefit exclusion, PA not submitted at this time

## 2024-10-24 ENCOUNTER — Other Ambulatory Visit: Payer: Self-pay | Admitting: Medical Genetics

## 2024-10-24 DIAGNOSIS — Z006 Encounter for examination for normal comparison and control in clinical research program: Secondary | ICD-10-CM

## 2024-12-27 ENCOUNTER — Other Ambulatory Visit (HOSPITAL_COMMUNITY): Payer: Self-pay

## 2025-01-30 ENCOUNTER — Other Ambulatory Visit (HOSPITAL_COMMUNITY): Payer: Self-pay
